# Patient Record
Sex: Male | Born: 1970 | Race: White | Hispanic: No | Marital: Single | Smoking: Never smoker
Health system: Southern US, Community
[De-identification: ages and names within clinical notes are randomized; demographics above are authoritative.]

## PROBLEM LIST (undated history)

## (undated) DIAGNOSIS — I10 Essential (primary) hypertension: Secondary | ICD-10-CM

## (undated) DIAGNOSIS — N200 Calculus of kidney: Secondary | ICD-10-CM

## (undated) DIAGNOSIS — E039 Hypothyroidism, unspecified: Secondary | ICD-10-CM

## (undated) HISTORY — PX: EXTRACORPOREAL SHOCK WAVE LITHOTRIPSY: SHX1557

---

## 2013-09-29 ENCOUNTER — Other Ambulatory Visit: Payer: Self-pay | Admitting: Neurosurgery

## 2013-10-05 ENCOUNTER — Encounter (HOSPITAL_COMMUNITY)
Admission: RE | Admit: 2013-10-05 | Discharge: 2013-10-05 | Disposition: A | Discharge status: Home / Self Care | Payer: BC Managed Care – PPO | Source: Ambulatory Visit | Attending: Anesthesiology | Admitting: Anesthesiology

## 2013-10-05 ENCOUNTER — Encounter (HOSPITAL_COMMUNITY)
Admission: RE | Admit: 2013-10-05 | Discharge: 2013-10-05 | Disposition: A | Discharge status: Home / Self Care | Payer: BC Managed Care – PPO | Source: Ambulatory Visit | Attending: Neurosurgery | Admitting: Neurosurgery

## 2013-10-05 ENCOUNTER — Encounter (HOSPITAL_COMMUNITY): Payer: Self-pay

## 2013-10-05 DIAGNOSIS — Z01810 Encounter for preprocedural cardiovascular examination: Secondary | ICD-10-CM | POA: Insufficient documentation

## 2013-10-05 DIAGNOSIS — Z01812 Encounter for preprocedural laboratory examination: Secondary | ICD-10-CM | POA: Insufficient documentation

## 2013-10-05 DIAGNOSIS — Z01818 Encounter for other preprocedural examination: Secondary | ICD-10-CM | POA: Insufficient documentation

## 2013-10-05 HISTORY — DX: Calculus of kidney: N20.0

## 2013-10-05 HISTORY — DX: Essential (primary) hypertension: I10

## 2013-10-05 HISTORY — DX: Hypothyroidism, unspecified: E03.9

## 2013-10-05 LAB — CBC
HCT: 45.2 % (ref 39.0–52.0)
Hemoglobin: 16.6 g/dL (ref 13.0–17.0)
MCH: 30.3 pg (ref 26.0–34.0)
MCHC: 36.7 g/dL — AB (ref 30.0–36.0)
MCV: 82.5 fL (ref 78.0–100.0)
PLATELETS: 195 10*3/uL (ref 150–400)
RBC: 5.48 MIL/uL (ref 4.22–5.81)
RDW: 12.6 % (ref 11.5–15.5)
WBC: 6.4 10*3/uL (ref 4.0–10.5)

## 2013-10-05 LAB — BASIC METABOLIC PANEL
BUN: 21 mg/dL (ref 6–23)
CALCIUM: 9.4 mg/dL (ref 8.4–10.5)
CO2: 30 mEq/L (ref 19–32)
CREATININE: 0.91 mg/dL (ref 0.50–1.35)
Chloride: 100 mEq/L (ref 96–112)
GFR calc Af Amer: >90 mL/min (ref >90)
GFR calc non Af Amer: >90 mL/min (ref >90)
Glucose, Bld: 95 mg/dL (ref 70–99)
Potassium: 4.4 mEq/L (ref 3.7–5.3)
SODIUM: 142 meq/L (ref 137–147)

## 2013-10-05 LAB — SURGICAL PCR SCREEN
MRSA, PCR: NEGATIVE
Staphylococcus aureus: NEGATIVE

## 2013-10-05 NOTE — Progress Notes (Signed)
Patient works around [employer].   Does not want to wear the blue blood bank until DOS.  da

## 2013-10-05 NOTE — Pre-Procedure Instructions (Signed)
Spencer LenzVictor A Dehner  10/05/2013   Your procedure is scheduled on:  Tuesday, March 24th   [facility]  2 * 3 at  8:00 AM.  Call this number if you have problems the morning of surgery: [phone]   Remember:   Do not eat food or drink liquids after midnight Monday.   Take these medicines the morning of surgery with A SIP OF WATER: Coreg, Levothyroxine   Do not wear jewelry - no rings watches.  Do not wear lotions or colognes.  You may NOT  wear deodorant.   Men may shave face and neck.   Do not bring valuables to the hospital.  [facility] is not [facility] for any belongings or valuables.               Contacts, dentures or bridgework may not be worn into surgery.  Leave suitcase in the car. After surgery it may be brought to your room.  For patients admitted to the hospital, discharge time is determined by your treatment team.               Name and phone number of your driver:  Roland EarlCandy  Strider    Cousin     [phone]   Special Instructions:  "preparing for surgery" instruction sheet   Please read over the following fact sheets that you were given: Blood Transfusion Information, MRSA Information and Surgical Site Infection Prevention

## 2013-10-09 MED ORDER — CEFAZOLIN SODIUM-DEXTROSE 2-3 GM-% IV SOLR
2.0000 g | INTRAVENOUS | Status: AC
  Administered 2013-10-10: 2 g via INTRAVENOUS
  Filled 2013-10-09: qty 50

## 2013-10-09 MED ORDER — DEXAMETHASONE SODIUM PHOSPHATE 10 MG/ML IJ SOLN
10.0000 mg | INTRAMUSCULAR | Status: AC
  Administered 2013-10-10: 10 mg via INTRAVENOUS
  Filled 2013-10-09: qty 1

## 2013-10-10 ENCOUNTER — Inpatient Hospital Stay (HOSPITAL_COMMUNITY)
Admission: RE | Admit: 2013-10-10 | Discharge: 2013-10-12 | DRG: 460 | Disposition: A | Discharge status: Home / Self Care | Payer: BC Managed Care – PPO | Source: Ambulatory Visit | Attending: Neurosurgery | Admitting: Neurosurgery

## 2013-10-10 ENCOUNTER — Ambulatory Visit (HOSPITAL_COMMUNITY): Payer: BC Managed Care – PPO

## 2013-10-10 ENCOUNTER — Encounter (HOSPITAL_COMMUNITY): Payer: Self-pay | Admitting: *Deleted

## 2013-10-10 ENCOUNTER — Ambulatory Visit (HOSPITAL_COMMUNITY): Payer: BC Managed Care – PPO | Admitting: Certified Registered Nurse Anesthetist

## 2013-10-10 ENCOUNTER — Encounter (HOSPITAL_COMMUNITY): Payer: BC Managed Care – PPO | Admitting: Certified Registered Nurse Anesthetist

## 2013-10-10 ENCOUNTER — Encounter (HOSPITAL_COMMUNITY)
Admission: RE | Disposition: A | Discharge status: Home / Self Care | Payer: Self-pay | Source: Ambulatory Visit | Attending: Neurosurgery

## 2013-10-10 DIAGNOSIS — Z01810 Encounter for preprocedural cardiovascular examination: Secondary | ICD-10-CM

## 2013-10-10 DIAGNOSIS — M4316 Spondylolisthesis, lumbar region: Secondary | ICD-10-CM | POA: Diagnosis present

## 2013-10-10 DIAGNOSIS — E039 Hypothyroidism, unspecified: Secondary | ICD-10-CM | POA: Diagnosis present

## 2013-10-10 DIAGNOSIS — Z79899 Other long term (current) drug therapy: Secondary | ICD-10-CM

## 2013-10-10 DIAGNOSIS — Z01812 Encounter for preprocedural laboratory examination: Secondary | ICD-10-CM

## 2013-10-10 DIAGNOSIS — I10 Essential (primary) hypertension: Secondary | ICD-10-CM | POA: Diagnosis present

## 2013-10-10 DIAGNOSIS — Z01818 Encounter for other preprocedural examination: Secondary | ICD-10-CM

## 2013-10-10 DIAGNOSIS — M48061 Spinal stenosis, lumbar region without neurogenic claudication: Secondary | ICD-10-CM | POA: Diagnosis present

## 2013-10-10 DIAGNOSIS — Q762 Congenital spondylolisthesis: Principal | ICD-10-CM

## 2013-10-10 HISTORY — PX: MAXIMUM ACCESS (MAS)POSTERIOR LUMBAR INTERBODY FUSION (PLIF) 1 LEVEL: SHX6368

## 2013-10-10 LAB — TYPE AND SCREEN
ABO/RH(D): A POS
Antibody Screen: NEGATIVE

## 2013-10-10 LAB — ABO/RH: ABO/RH(D): A POS

## 2013-10-10 SURGERY — FOR MAXIMUM ACCESS (MAS) POSTERIOR LUMBAR INTERBODY FUSION (PLIF) 1 LEVEL
Anesthesia: General | Site: Back

## 2013-10-10 MED ORDER — KCL IN DEXTROSE-NACL 20-5-0.45 MEQ/L-%-% IV SOLN
80.0000 mL/h | INTRAVENOUS | Status: DC
  Filled 2013-10-10 (×6): qty 1000

## 2013-10-10 MED ORDER — THROMBIN 20000 UNITS EX SOLR
CUTANEOUS | Status: DC | PRN
  Administered 2013-10-10: 11:00:00 via TOPICAL

## 2013-10-10 MED ORDER — ONDANSETRON HCL 4 MG/2ML IJ SOLN: 4.0000 mg | INTRAMUSCULAR | Status: DC | PRN

## 2013-10-10 MED ORDER — GLYCOPYRROLATE 0.2 MG/ML IJ SOLN
INTRAMUSCULAR | Status: DC | PRN
  Administered 2013-10-10: .6 mg via INTRAVENOUS

## 2013-10-10 MED ORDER — OXYCODONE-ACETAMINOPHEN 5-325 MG PO TABS
1.0000 | ORAL_TABLET | ORAL | Status: DC | PRN
  Administered 2013-10-10 – 2013-10-12 (×9): 2 via ORAL
  Filled 2013-10-10 (×9): qty 2

## 2013-10-10 MED ORDER — GLYCOPYRROLATE 0.2 MG/ML IJ SOLN
INTRAMUSCULAR | Status: AC
  Filled 2013-10-10: qty 3

## 2013-10-10 MED ORDER — CARVEDILOL 3.125 MG PO TABS
ORAL_TABLET | ORAL | Status: AC
  Filled 2013-10-10: qty 2

## 2013-10-10 MED ORDER — ARTIFICIAL TEARS OP OINT
TOPICAL_OINTMENT | OPHTHALMIC | Status: AC
  Filled 2013-10-10: qty 3.5

## 2013-10-10 MED ORDER — LIDOCAINE HCL (CARDIAC) 20 MG/ML IV SOLN
INTRAVENOUS | Status: DC | PRN
  Administered 2013-10-10: 80 mg via INTRAVENOUS

## 2013-10-10 MED ORDER — HYDROMORPHONE HCL PF 1 MG/ML IJ SOLN
0.2500 mg | INTRAMUSCULAR | Status: DC | PRN
  Administered 2013-10-10: 0.5 mg via INTRAVENOUS

## 2013-10-10 MED ORDER — ONDANSETRON HCL 4 MG/2ML IJ SOLN: 4.0000 mg | Freq: Once | INTRAMUSCULAR | Status: DC | PRN

## 2013-10-10 MED ORDER — NEOSTIGMINE METHYLSULFATE 1 MG/ML IJ SOLN
INTRAMUSCULAR | Status: AC
  Filled 2013-10-10: qty 10

## 2013-10-10 MED ORDER — CARVEDILOL 6.25 MG PO TABS
6.2500 mg | ORAL_TABLET | Freq: Once | ORAL | Status: AC
  Administered 2013-10-10: 6.25 mg via ORAL

## 2013-10-10 MED ORDER — SODIUM CHLORIDE 0.9 % IJ SOLN
3.0000 mL | Freq: Two times a day (BID) | INTRAMUSCULAR | Status: DC
  Administered 2013-10-11: 3 mL via INTRAVENOUS

## 2013-10-10 MED ORDER — PROPOFOL 10 MG/ML IV BOLUS
INTRAVENOUS | Status: AC
  Filled 2013-10-10: qty 20

## 2013-10-10 MED ORDER — 0.9 % SODIUM CHLORIDE (POUR BTL) OPTIME
TOPICAL | Status: DC | PRN
  Administered 2013-10-10: 1000 mL

## 2013-10-10 MED ORDER — NEOSTIGMINE METHYLSULFATE 1 MG/ML IJ SOLN
INTRAMUSCULAR | Status: DC | PRN
  Administered 2013-10-10: 4 mg via INTRAVENOUS

## 2013-10-10 MED ORDER — OXYCODONE HCL 5 MG PO TABS: 5.0000 mg | ORAL_TABLET | Freq: Once | ORAL | Status: DC | PRN

## 2013-10-10 MED ORDER — ROCURONIUM BROMIDE 100 MG/10ML IV SOLN
INTRAVENOUS | Status: DC | PRN
  Administered 2013-10-10: 30 mg via INTRAVENOUS
  Administered 2013-10-10: 50 mg via INTRAVENOUS
  Administered 2013-10-10: 10 mg via INTRAVENOUS

## 2013-10-10 MED ORDER — BUPIVACAINE HCL (PF) 0.5 % IJ SOLN
INTRAMUSCULAR | Status: DC | PRN
  Administered 2013-10-10: 20 mL

## 2013-10-10 MED ORDER — LIDOCAINE HCL (CARDIAC) 20 MG/ML IV SOLN
INTRAVENOUS | Status: AC
  Filled 2013-10-10: qty 5

## 2013-10-10 MED ORDER — OXYCODONE HCL 5 MG/5ML PO SOLN: 5.0000 mg | Freq: Once | ORAL | Status: DC | PRN

## 2013-10-10 MED ORDER — TRIAMTERENE-HCTZ 37.5-25 MG PO TABS
0.5000 | ORAL_TABLET | Freq: Every day | ORAL | Status: DC
  Administered 2013-10-10: 21:00:00 via ORAL
  Administered 2013-10-11 – 2013-10-12 (×2): 0.5 via ORAL
  Filled 2013-10-10 (×3): qty 0.5

## 2013-10-10 MED ORDER — MIDAZOLAM HCL 2 MG/2ML IJ SOLN
INTRAMUSCULAR | Status: AC
  Filled 2013-10-10: qty 2

## 2013-10-10 MED ORDER — ACETAMINOPHEN 325 MG PO TABS: 325.0000 mg | ORAL_TABLET | ORAL | Status: DC | PRN

## 2013-10-10 MED ORDER — SODIUM CHLORIDE 0.9 % IV SOLN: 250.0000 mL | INTRAVENOUS | Status: DC

## 2013-10-10 MED ORDER — ACETAMINOPHEN 325 MG PO TABS: 650.0000 mg | ORAL_TABLET | ORAL | Status: DC | PRN

## 2013-10-10 MED ORDER — ONDANSETRON HCL 4 MG/2ML IJ SOLN
INTRAMUSCULAR | Status: AC
  Filled 2013-10-10: qty 2

## 2013-10-10 MED ORDER — ACETAMINOPHEN 160 MG/5ML PO SOLN
325.0000 mg | ORAL | Status: DC | PRN
  Filled 2013-10-10: qty 20.3

## 2013-10-10 MED ORDER — BACITRACIN ZINC 500 UNIT/GM EX OINT
TOPICAL_OINTMENT | CUTANEOUS | Status: DC | PRN
  Administered 2013-10-10: 1 via TOPICAL

## 2013-10-10 MED ORDER — ARTIFICIAL TEARS OP OINT
TOPICAL_OINTMENT | OPHTHALMIC | Status: DC | PRN
  Administered 2013-10-10: 1 via OPHTHALMIC

## 2013-10-10 MED ORDER — PROPOFOL 10 MG/ML IV BOLUS
INTRAVENOUS | Status: DC | PRN
  Administered 2013-10-10: 150 mg via INTRAVENOUS

## 2013-10-10 MED ORDER — FENTANYL CITRATE 0.05 MG/ML IJ SOLN
INTRAMUSCULAR | Status: AC
  Filled 2013-10-10: qty 5

## 2013-10-10 MED ORDER — CARVEDILOL 6.25 MG PO TABS
6.2500 mg | ORAL_TABLET | Freq: Two times a day (BID) | ORAL | Status: DC
  Administered 2013-10-10 – 2013-10-12 (×4): 6.25 mg via ORAL
  Filled 2013-10-10 (×6): qty 1

## 2013-10-10 MED ORDER — SODIUM CHLORIDE 0.9 % IR SOLN
Status: DC | PRN
  Administered 2013-10-10: 11:00:00

## 2013-10-10 MED ORDER — PANTOPRAZOLE SODIUM 40 MG IV SOLR
40.0000 mg | Freq: Every day | INTRAVENOUS | Status: DC
  Administered 2013-10-10: 40 mg via INTRAVENOUS
  Filled 2013-10-10 (×2): qty 40

## 2013-10-10 MED ORDER — CYCLOBENZAPRINE HCL 10 MG PO TABS
10.0000 mg | ORAL_TABLET | Freq: Three times a day (TID) | ORAL | Status: DC | PRN
Start: 2013-10-10 — End: 2013-10-12
  Administered 2013-10-10 – 2013-10-12 (×3): 10 mg via ORAL
  Filled 2013-10-10 (×3): qty 1

## 2013-10-10 MED ORDER — SODIUM CHLORIDE 0.9 % IJ SOLN: 3.0000 mL | INTRAMUSCULAR | Status: DC | PRN

## 2013-10-10 MED ORDER — LACTATED RINGERS IV SOLN
INTRAVENOUS | Status: DC
  Administered 2013-10-10 (×3): via INTRAVENOUS

## 2013-10-10 MED ORDER — LEVOTHYROXINE SODIUM 75 MCG PO TABS
75.0000 ug | ORAL_TABLET | Freq: Every day | ORAL | Status: DC
  Administered 2013-10-11 – 2013-10-12 (×2): 75 ug via ORAL
  Filled 2013-10-10 (×3): qty 1

## 2013-10-10 MED ORDER — MENTHOL 3 MG MT LOZG: 1.0000 | LOZENGE | OROMUCOSAL | Status: DC | PRN

## 2013-10-10 MED ORDER — PHENOL 1.4 % MT LIQD: 1.0000 | OROMUCOSAL | Status: DC | PRN

## 2013-10-10 MED ORDER — HYDROMORPHONE HCL PF 1 MG/ML IJ SOLN: 1.0000 mg | INTRAMUSCULAR | Status: DC | PRN

## 2013-10-10 MED ORDER — ONDANSETRON HCL 4 MG/2ML IJ SOLN
INTRAMUSCULAR | Status: DC | PRN
  Administered 2013-10-10: 4 mg via INTRAVENOUS

## 2013-10-10 MED ORDER — CEFAZOLIN SODIUM-DEXTROSE 2-3 GM-% IV SOLR
2.0000 g | Freq: Three times a day (TID) | INTRAVENOUS | Status: AC
  Administered 2013-10-10 – 2013-10-11 (×2): 2 g via INTRAVENOUS
  Filled 2013-10-10 (×2): qty 50

## 2013-10-10 MED ORDER — ACETAMINOPHEN 650 MG RE SUPP
650.0000 mg | RECTAL | Status: DC | PRN
Start: 2013-10-10 — End: 2013-10-12

## 2013-10-10 MED ORDER — FENTANYL CITRATE 0.05 MG/ML IJ SOLN
INTRAMUSCULAR | Status: DC | PRN
  Administered 2013-10-10: 100 ug via INTRAVENOUS
  Administered 2013-10-10: 150 ug via INTRAVENOUS
  Administered 2013-10-10: 50 ug via INTRAVENOUS
  Administered 2013-10-10: 100 ug via INTRAVENOUS

## 2013-10-10 MED ORDER — HYDROMORPHONE HCL PF 1 MG/ML IJ SOLN
INTRAMUSCULAR | Status: AC
  Filled 2013-10-10: qty 1

## 2013-10-10 SURGICAL SUPPLY — 73 items
BAG DECANTER FOR FLEXI CONT (MISCELLANEOUS) ×2 IMPLANT
BENZOIN TINCTURE PRP APPL 2/3 (GAUZE/BANDAGES/DRESSINGS) ×2 IMPLANT
BLADE SURG ROTATE 9660 (MISCELLANEOUS) ×2 IMPLANT
BONE EQUIVA 10CC (Bone Implant) ×2 IMPLANT
BRUSH SCRUB EZ PLAIN DRY (MISCELLANEOUS) ×2 IMPLANT
BUR CUTTER 7.0 ROUND (BURR) ×2 IMPLANT
BUR MATCHSTICK NEURO 3.0 LAGG (BURR) ×2 IMPLANT
CANISTER SUCT 3000ML (MISCELLANEOUS) ×2 IMPLANT
CONT SPEC 4OZ CLIKSEAL STRL BL (MISCELLANEOUS) ×4 IMPLANT
COVER BACK TABLE 24X17X13 BIG (DRAPES) IMPLANT
COVER TABLE BACK 60X90 (DRAPES) ×2 IMPLANT
DERMABOND ADHESIVE PROPEN (GAUZE/BANDAGES/DRESSINGS) ×1
DERMABOND ADVANCED (GAUZE/BANDAGES/DRESSINGS) ×1
DERMABOND ADVANCED .7 DNX12 (GAUZE/BANDAGES/DRESSINGS) ×1 IMPLANT
DERMABOND ADVANCED .7 DNX6 (GAUZE/BANDAGES/DRESSINGS) ×1 IMPLANT
DILATOR NON-RADIOLUCENT CANN (MISCELLANEOUS) ×4 IMPLANT
DRAPE C-ARM 42X72 X-RAY (DRAPES) ×2 IMPLANT
DRAPE C-ARMOR (DRAPES) ×2 IMPLANT
DRAPE LAPAROTOMY 100X72X124 (DRAPES) ×2 IMPLANT
DRAPE SURG 17X23 STRL (DRAPES) ×4 IMPLANT
DRESSING TELFA 8X3 (GAUZE/BANDAGES/DRESSINGS) ×2 IMPLANT
DRSG OPSITE POSTOP 4X6 (GAUZE/BANDAGES/DRESSINGS) ×2 IMPLANT
DURAPREP 26ML APPLICATOR (WOUND CARE) ×2 IMPLANT
ELECT BLADE 4.0 EZ CLEAN MEGAD (MISCELLANEOUS) ×2
ELECT REM PT RETURN 9FT ADLT (ELECTROSURGICAL) ×2
ELECTRODE BLDE 4.0 EZ CLN MEGD (MISCELLANEOUS) ×1 IMPLANT
ELECTRODE REM PT RTRN 9FT ADLT (ELECTROSURGICAL) ×1 IMPLANT
EVACUATOR 1/8 PVC DRAIN (DRAIN) ×2 IMPLANT
GAUZE SPONGE 4X4 16PLY XRAY LF (GAUZE/BANDAGES/DRESSINGS) IMPLANT
GLOVE BIOGEL PI IND STRL 7.0 (GLOVE) ×2 IMPLANT
GLOVE BIOGEL PI INDICATOR 7.0 (GLOVE) ×2
GLOVE ECLIPSE 8.0 STRL XLNG CF (GLOVE) ×8 IMPLANT
GLOVE ECLIPSE 8.5 STRL (GLOVE) ×2 IMPLANT
GLOVE EXAM NITRILE LRG STRL (GLOVE) IMPLANT
GLOVE EXAM NITRILE MD LF STRL (GLOVE) IMPLANT
GLOVE EXAM NITRILE XS STR PU (GLOVE) IMPLANT
GLOVE SURG SS PI 7.0 STRL IVOR (GLOVE) ×8 IMPLANT
GOWN BRE IMP SLV AUR LG STRL (GOWN DISPOSABLE) IMPLANT
GOWN BRE IMP SLV AUR XL STRL (GOWN DISPOSABLE) IMPLANT
GOWN STRL REIN 2XL LVL4 (GOWN DISPOSABLE) IMPLANT
GOWN STRL REUS W/ TWL XL LVL3 (GOWN DISPOSABLE) ×5 IMPLANT
GOWN STRL REUS W/TWL XL LVL3 (GOWN DISPOSABLE) ×5
K-WIRE NITHNOL TROCAR TIP (WIRE) ×8 IMPLANT
KIT BASIN OR (CUSTOM PROCEDURE TRAY) ×2 IMPLANT
KIT ROOM TURNOVER OR (KITS) ×2 IMPLANT
MILL MEDIUM DISP (BLADE) ×2 IMPLANT
NEEDLE HYPO 22GX1.5 SAFETY (NEEDLE) ×2 IMPLANT
NEEDLE TARGETING (NEEDLE) ×8 IMPLANT
NS IRRIG 1000ML POUR BTL (IV SOLUTION) ×2 IMPLANT
PACK LAMINECTOMY NEURO (CUSTOM PROCEDURE TRAY) ×2 IMPLANT
PAD ARMBOARD 7.5X6 YLW CONV (MISCELLANEOUS) ×6 IMPLANT
PATTIES SURGICAL .75X.75 (GAUZE/BANDAGES/DRESSINGS) IMPLANT
PEEK OPTIMA 9X9X22 (Peek) ×4 IMPLANT
ROD PATHFINDER 40MM (Rod) ×2 IMPLANT
SCREW MIN INVASIVE 6.5X45 (Screw) ×4 IMPLANT
SCREW PATHFINDER 6.5X50 (Screw) ×4 IMPLANT
SPONGE GAUZE 4X4 12PLY (GAUZE/BANDAGES/DRESSINGS) IMPLANT
SPONGE LAP 4X18 X RAY DECT (DISPOSABLE) ×2 IMPLANT
SPONGE SURGIFOAM ABS GEL 100 (HEMOSTASIS) ×2 IMPLANT
STRIP CLOSURE SKIN 1/2X4 (GAUZE/BANDAGES/DRESSINGS) ×2 IMPLANT
SUT PROLENE 0 CT 1 30 (SUTURE) ×2 IMPLANT
SUT VIC AB 0 CT1 18XCR BRD8 (SUTURE) ×2 IMPLANT
SUT VIC AB 0 CT1 8-18 (SUTURE) ×2
SUT VIC AB 2-0 OS6 18 (SUTURE) ×6 IMPLANT
SUT VIC AB 3-0 CP2 18 (SUTURE) ×2 IMPLANT
SYR 20ML ECCENTRIC (SYRINGE) ×2 IMPLANT
TOP CLSR SEQUOIA (Orthopedic Implant) ×8 IMPLANT
TOWEL OR 17X24 6PK STRL BLUE (TOWEL DISPOSABLE) ×2 IMPLANT
TOWEL OR 17X26 10 PK STRL BLUE (TOWEL DISPOSABLE) ×2 IMPLANT
TRAP SPECIMEN MUCOUS 40CC (MISCELLANEOUS) ×2 IMPLANT
TRAY FOLEY CATH 14FRSI W/METER (CATHETERS) IMPLANT
TRAY FOLEY IC TEMP SENS 16FR (CATHETERS) ×2 IMPLANT
WATER STERILE IRR 1000ML POUR (IV SOLUTION) ×2 IMPLANT

## 2013-10-10 NOTE — Preoperative (Signed)
Beta Blockers   Reason not to administer Beta Blockers:Not Applicable 

## 2013-10-10 NOTE — H&P (Signed)
Spencer Perkins is an 43 y.o. male.   Chief Complaint: Back and bilateral leg pain HPI: The patient is a 43 year old gentleman whose been dealing with back and bilateral leg pain for many years. He's doing the best to tolerate it and his chiropractic treatments through the years including electrical stimulation and ultrasound as well as anti-inflammatory medications. His only gotten much with these treatments but really want to try and avoid surgery in spite of intermittent severe pain. Recently his pain has gotten significantly worse and to go down the right leg more than the left. He's had a new MRI scan and comes for evaluation. Patient takes vitamin through the years persistent walking increases pain with standing still is actually worse evaluation the office the films were reviewed which showed a progressive spondylolisthesis L4-5 with severe neural compression. After discussing the options the patient requested surgery now comes for interbody fusion with pedicle screw fixation. I had a long discussion with him regarding the risks and benefits of surgical intervention. The risks discussed include but are not limited to bleeding infection weakness numbness paralysis spinal fluid leak 12 instrumentation nonunion coma and death. We have discussed alternative methods of therapy offered risks and benefits of nonintervention. He's had the opportunity to ask numerous questions and appears to understand. With this information in hand he has requested we proceed with surgery.  Past Medical History  Diagnosis Date  . Kidney stones      tried extraction with no luck...then litho--all gone  . Hypertension   . Hypothyroidism     Past Surgical History  Procedure Laterality Date  . Extracorporeal shock wave lithotripsy      2012    History reviewed. No pertinent family history. Social History:  reports that he has never smoked. He does not have any smokeless tobacco history on file. He reports that he  does not drink alcohol or use illicit drugs.  Allergies:  Allergies  Allergen Reactions  . Codeine     Nervous shakes and crying    Medications Prior to Admission  Medication Sig Dispense Refill  . carvedilol (COREG) 6.25 MG tablet Take 6.25 mg by mouth 2 (two) times daily with a meal.      . levothyroxine (SYNTHROID, LEVOTHROID) 75 MCG tablet Take 75 mcg by mouth daily before breakfast.      . triamterene-hydrochlorothiazide (MAXZIDE-25) 37.5-25 MG per tablet Take 0.5 tablets by mouth daily.        Results for orders placed during the hospital encounter of 10/10/13 (from the past 48 hour(s))  TYPE AND SCREEN     Status: None   Collection Time    10/10/13  8:20 AM      Result Value Ref Range   ABO/RH(D) A POS     Antibody Screen NEG     Sample Expiration 10/13/2013    ABO/RH     Status: None   Collection Time    10/10/13  8:20 AM      Result Value Ref Range   ABO/RH(D) A POS     No results found.  Positive for nasal congestion high blood pressure and leg pain  Blood pressure 170/101, pulse 76, temperature 98.3 F (36.8 C), temperature source Oral, resp. rate 20, weight 106.283 kg (234 lb 5 oz), SpO2 99.00%.  The patient is awake alert and oriented. His no facial asymmetry. His gait is normal. He has mild decreased right knee jerk reflex. Strength is 5 over 5 sensation is intact Assessment/Plan Impression is  that of spondylolisthesis with severe neural compression at L4-5. The plan is for an L4-5 decompression with interbody fusion followed by percutaneous pedicle screw fixation.  Reinaldo MeekerKRITZER,Jonay Hitchcock O, MD 10/10/2013, 10:05 AM

## 2013-10-10 NOTE — Anesthesia Postprocedure Evaluation (Signed)
  Anesthesia Post-op Note  Patient: Spencer Perkins  Procedure(s) Performed: Procedure(s): LUMBAR FOUR TO LUMBAR FIVE MAXIMUM ACCESS (MAS) POSTERIOR LUMBAR INTERBODY FUSION (PLIF) 1 LEVEL (N/A)  Patient Location: PACU  Anesthesia Type:General  Level of Consciousness: awake, alert  and oriented  Airway and Oxygen Therapy: Patient Spontanous Breathing and Patient connected to nasal cannula oxygen  Post-op Pain: mild  Post-op Assessment: Post-op Vital signs reviewed, Patient's Cardiovascular Status Stable, Respiratory Function Stable, Patent Airway, No signs of Nausea or vomiting and Pain level controlled  Post-op Vital Signs: Reviewed and stable  Complications: No apparent anesthesia complications

## 2013-10-10 NOTE — Anesthesia Preprocedure Evaluation (Addendum)
Anesthesia Evaluation  Patient identified by MRN, date of birth, ID band Patient awake    Reviewed: Allergy & Precautions, H&P , NPO status , Patient's Chart, lab work & pertinent test results, reviewed documented beta blocker date and time   History of Anesthesia Complications Negative for: history of anesthetic complications  Airway Mallampati: II      Dental  (+) Teeth Intact,    Pulmonary neg pulmonary ROS,  breath sounds clear to auscultation        Cardiovascular hypertension, Pt. on home beta blockers and Pt. on medications Rhythm:Regular Rate:Normal     Neuro/Psych Lower back pain negative psych ROS   GI/Hepatic   Endo/Other  Hypothyroidism   Renal/GU Renal disease     Musculoskeletal negative musculoskeletal ROS (+)   Abdominal   Peds  Hematology   Anesthesia Other Findings   Reproductive/Obstetrics                         Anesthesia Physical Anesthesia Plan  ASA: II  Anesthesia Plan: General   Post-op Pain Management:    Induction: Intravenous  Airway Management Planned: Oral ETT  Additional Equipment: None  Intra-op Plan:   Post-operative Plan: Extubation in OR  Informed Consent: I have reviewed the patients History and Physical, chart, labs and discussed the procedure including the risks, benefits and alternatives for the proposed anesthesia with the patient or authorized representative who has indicated his/her understanding and acceptance.   Dental advisory given  Plan Discussed with: CRNA, Anesthesiologist and Surgeon  Anesthesia Plan Comments:        Anesthesia Quick Evaluation

## 2013-10-10 NOTE — Transfer of Care (Signed)
Immediate Anesthesia Transfer of Care Note  Patient: Spencer LenzVictor A Perkins  Procedure(s) Performed: Procedure(s): LUMBAR FOUR TO LUMBAR FIVE MAXIMUM ACCESS (MAS) POSTERIOR LUMBAR INTERBODY FUSION (PLIF) 1 LEVEL (N/A)  Patient Location: PACU  Anesthesia Type:General  Level of Consciousness: awake, alert  and oriented  Airway & Oxygen Therapy: Patient Spontanous Breathing and Patient connected to nasal cannula oxygen  Post-op Assessment: Report given to PACU RN and Post -op Vital signs reviewed and stable  Post vital signs: Reviewed and stable  Complications: No apparent anesthesia complications

## 2013-10-10 NOTE — Op Note (Signed)
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 Localizing x-rays taken prior to incision to identify the appropriate level. Midline incision was made above the spinous processes of L4 and L5. Using Bovie cutting current the incision was carried down to what not through the lumbar dorsal fascia. We then separated the plane between the subcutaneous fat and the fascia. We then did a subperiosteal dissection along the left-sided spinous processes and lamina and facet joints at L4-5. Self-retaining tract was placed for exposure and x-ray showed approach the appropriate level. Using the Leksell rongeur spinous processes of L4 and L5 were removed. We then removed the free-floating lamina of L4 with the inferior articulating facet. We then removed the superior one third of the L5 lamina and the medial two thirds of the superior facet. Residual bone was removed and saved for use later in the case. We then identified the disc space. We thoroughly decompressed the L4 and L5 nerve roots more so than needed for interbody fusion. We coagulated on the annulus incised the disc with a 15 blade. Was very hard to gain entry into the disc space initially we are able to distract up to a 9 mm size and reduce partially the spondylolisthesis. We then able to thoroughly cleaned out the disc and prepare for interbody fusion. We did this aggressively bilaterally. We used a  variety of instruments to remove all soft tissue residual disc and the endplates of L4 and L5 to prepare for interbody fusion. We then packed cages which were 9 mm x 9 mm x 22 mm deep. We filled with a mixture of autologous bone morselized allograft. We impacted without difficulty. Part placing the second cage we placed the same mixture deep within the interspace to help with interbody fusion. We then irrigated copiously controlled any bleeding with upper coagulation Gelfoam. We then closed the lumbar dorsal fascia the midline. We then placed percutaneous pedicle screws fashion standard fashion. We placed Jamshidi needle through the pedicles of L4 and L5 bilaterally. We passed a guidewire through each and followed in excellent position. We did sequential dilation through the soft tissue and then broke the cortical surface with the cortical bone all. We then tapped with a 6.0 mm tap and then placed 6.5 x 45 mm screws at L5 and 6.5 x 50 mm screws at L4. These were followed in good position. We then passed the rod down to the [place] and secured at the top of the screws with top loading nuts. We reduced the rod nicely the top of the screws and did tightening and final tightening with torque and counter torque. Final fluoroscopy in AP lateral direction looked excellent. We irrigated the wound once more and closed the fascia laterally with interrupted 0 Vicryl. We then closed the wound in multiple layers of Vicryl on the subcutaneous and subcuticular tissues. We did a running locking Prolene on the skin. Shortness was then applied the patient was extubated and taken to recovery room in stable condition.

## 2013-10-11 MED ORDER — PANTOPRAZOLE SODIUM 40 MG PO TBEC
40.0000 mg | DELAYED_RELEASE_TABLET | Freq: Every day | ORAL | Status: DC
  Administered 2013-10-11 – 2013-10-12 (×2): 40 mg via ORAL
  Filled 2013-10-11 (×2): qty 1

## 2013-10-11 NOTE — Progress Notes (Signed)
Utilization review complete 

## 2013-10-11 NOTE — Progress Notes (Signed)
Patient ID: Spencer LenzVictor A Perkins, male   DOB: [birth year], 43 y.o.   MRN: [id number] Afeb, vss Leg pain markedly improved C/O incisional pain. Wound clean and dry Will increase activity today, hopefully home tomorrow.

## 2013-10-12 ENCOUNTER — Encounter (HOSPITAL_COMMUNITY): Payer: Self-pay | Admitting: Neurosurgery

## 2013-10-12 MED ORDER — OXYCODONE-ACETAMINOPHEN 5-325 MG PO TABS: 1.0000 | ORAL_TABLET | ORAL | Status: DC | PRN

## 2013-10-12 NOTE — Discharge Summary (Signed)
  Physician Discharge Summary  Patient ID: Spencer LenzVictor A Perkins MRN: [id number] DOB/AGE: 10/09/1970 43 y.o.  Admit date: 10/10/2013 Discharge date: 10/12/2013  Admission Diagnoses:  Discharge Diagnoses:  Active Problems:   Spondylolisthesis of lumbar region   Discharged Condition: good  Hospital Course: Surgery Tuesday for 1 level plif. Did well. Pain much better post op. Ambulated well. Home pod 2, specific instructions given.  Consults: None  Significant Diagnostic Studies: none  Treatments: surgery: L 45 minimally invasive PLIF  Discharge Exam: Blood pressure 107/68, pulse 81, temperature 97.8 F (36.6 C), temperature source Oral, resp. rate 20, weight 106.283 kg (234 lb 5 oz), SpO2 92.00%. clean and dry wound; no new neuro issues  Disposition: Final discharge disposition not confirmed     Medication List    ASK your doctor about these medications       carvedilol 6.25 MG tablet  Commonly known as:  COREG  Take 6.25 mg by mouth 2 (two) times daily with a meal.     levothyroxine 75 MCG tablet  Commonly known as:  SYNTHROID, LEVOTHROID  Take 75 mcg by mouth daily before breakfast.     triamterene-hydrochlorothiazide 37.5-25 MG per tablet  Commonly known as:  MAXZIDE-25  Take 0.5 tablets by mouth daily.         At home rest most of the time. Get up 9 or 10 times each day and take a 15 or 20 minute walk. No riding in the car and to your first postoperative appointment. If you have neck surgery you may shower from the chest down starting on the third postoperative day. If you had back surgery he may start showering on the third postoperative day with saran wrap wrapped around your incisional area 3 times. After the shower remove the saran wrap. Take pain medicine as needed and other medications as instructed. Call my office for an appointment.  SignedReinaldo Meeker: Carrell Rahmani O, MD 10/12/2013, 12:45 PM

## 2013-10-12 NOTE — Progress Notes (Signed)
Pt. Alert and oriented,follows simple instructions, denies pain. Incision area without swelling, redness or S/S of infection. Voiding adequate clear yellow urine. Moving all extremities well and vitals stable and documented. Patient discharged home with family. Lumbar surgery notes instructions given to patient and family member for home safety and precautions. Pt. and family stated understanding of instructions given. Pain medication given to patient prior to discharged. 

## 2014-11-15 IMAGING — CR DG CHEST 2V
2 series · 2 of 2 positions shown · non-contrast
Comparison: None.

CLINICAL DATA: Preoperative lumbar surgery; hypertension

EXAM:
CHEST  2 VIEW

[w chest pa]
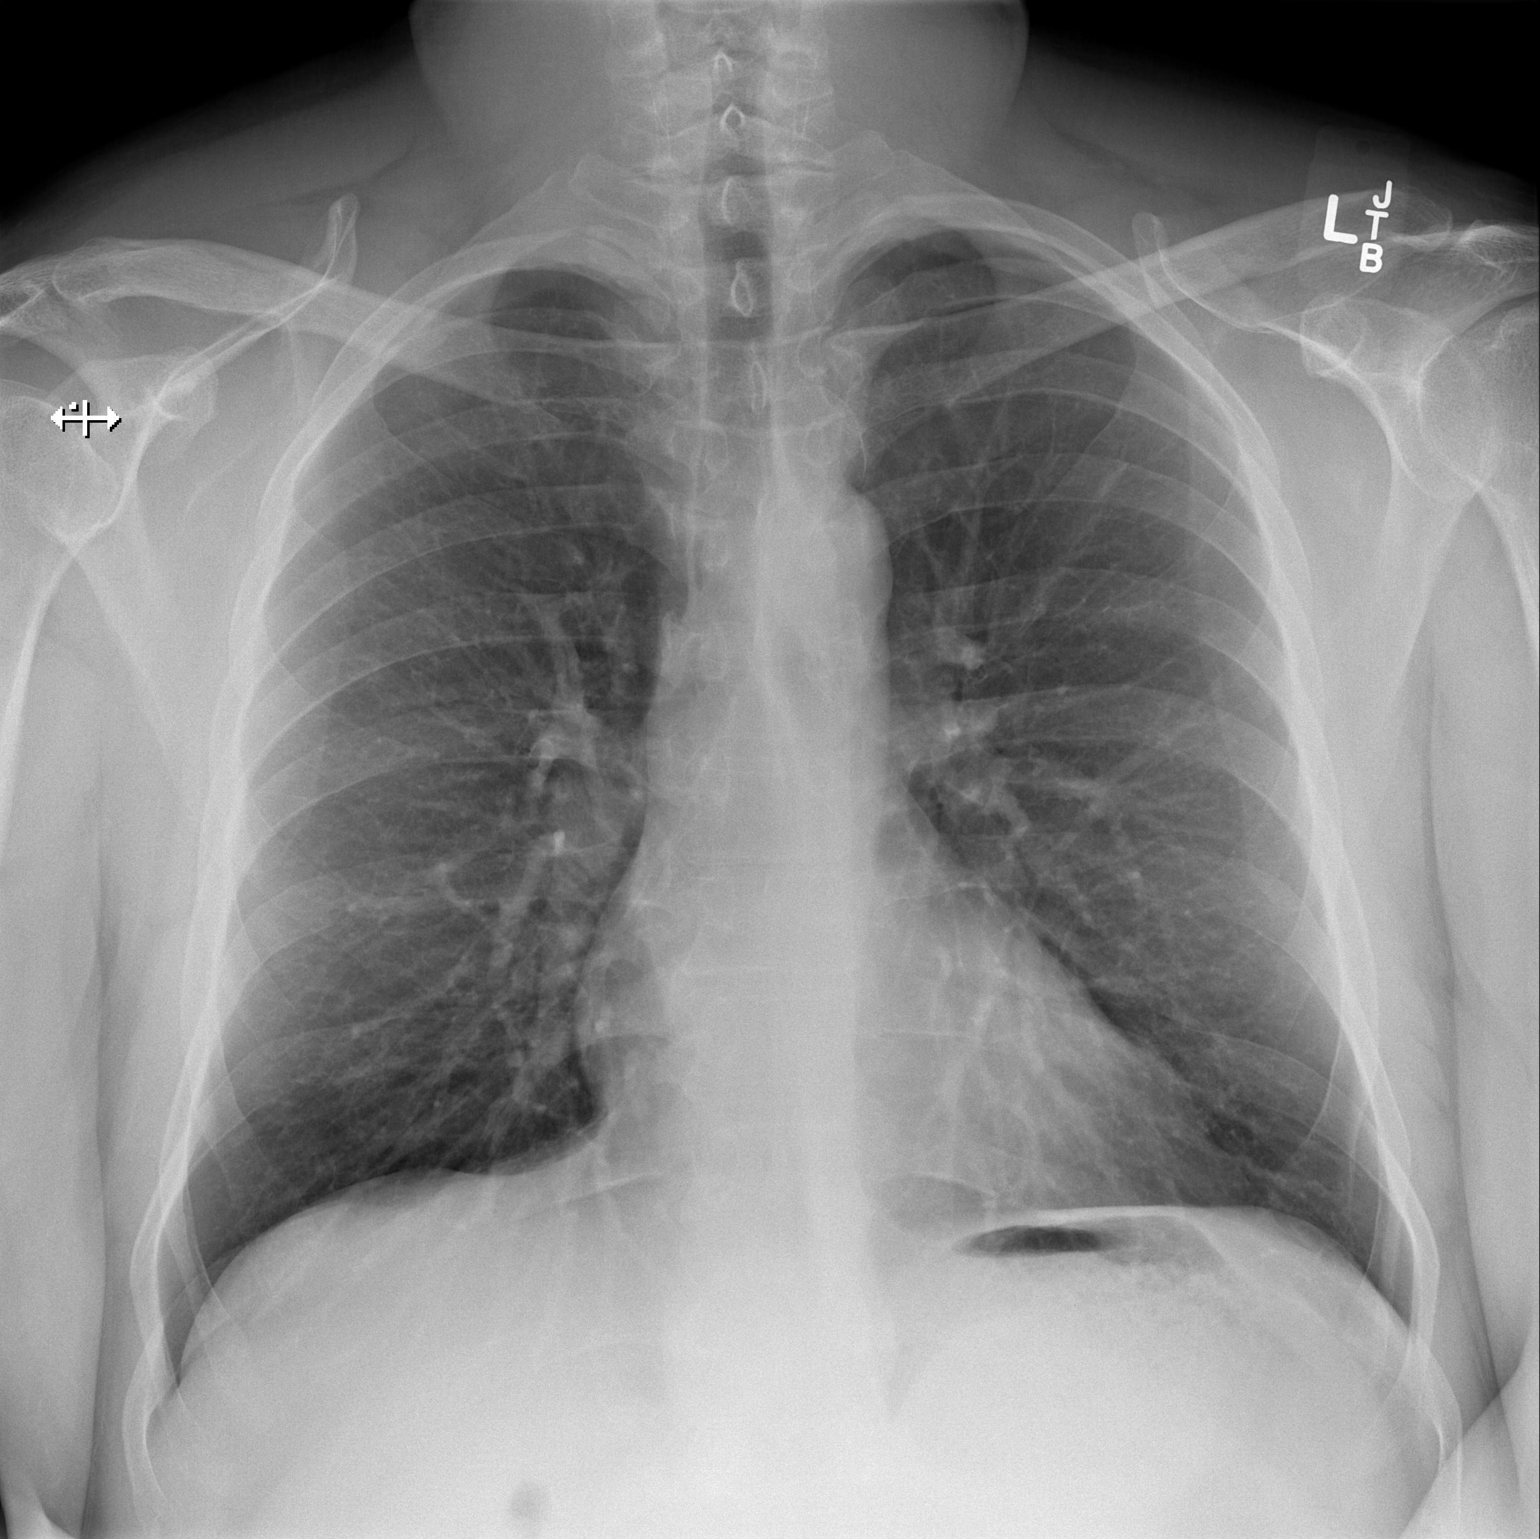

[w chest lat]
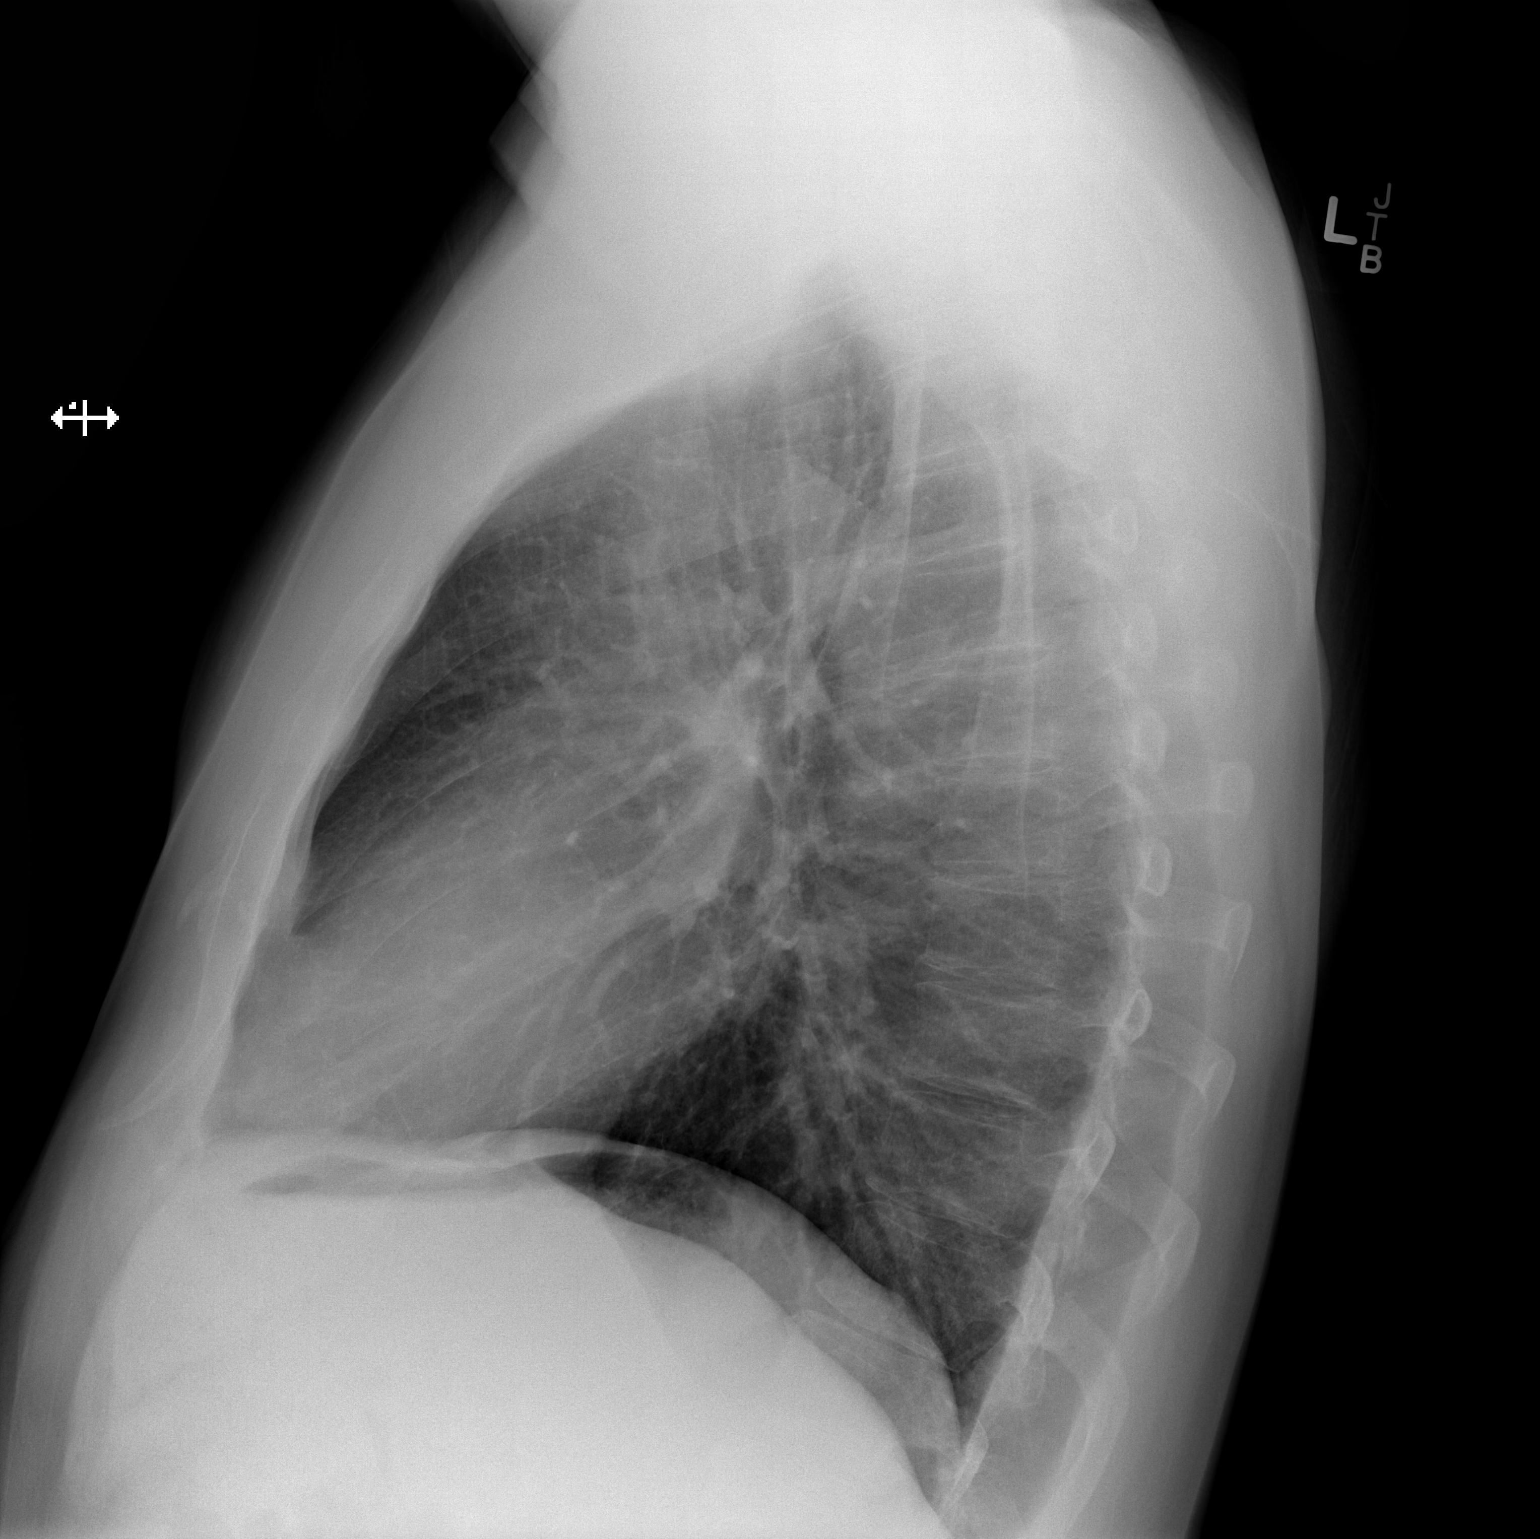

[2 of 2 positions shown; findings below may reference images not displayed]

FINDINGS: Lungs are clear. Heart size and pulmonary vascularity are normal. No
adenopathy. No bone lesions.
IMPRESSION: No abnormality noted.

## 2014-11-20 IMAGING — RF DG C-ARM 61-120 MIN
1 series · 2 of 2 positions shown · non-contrast
Comparison: MR LUMBAR SPINE W/O CM dated 09/07/2013

CLINICAL DATA: PLIF L4-5

EXAM:
LUMBAR SPINE - 2-3 VIEW; DG C-ARM 1-60 MIN
FLUOROSCOPY TIME:  1 min 58 seconds

[Series 1: run · 2 of 2 slices shown]
[im 1/2]
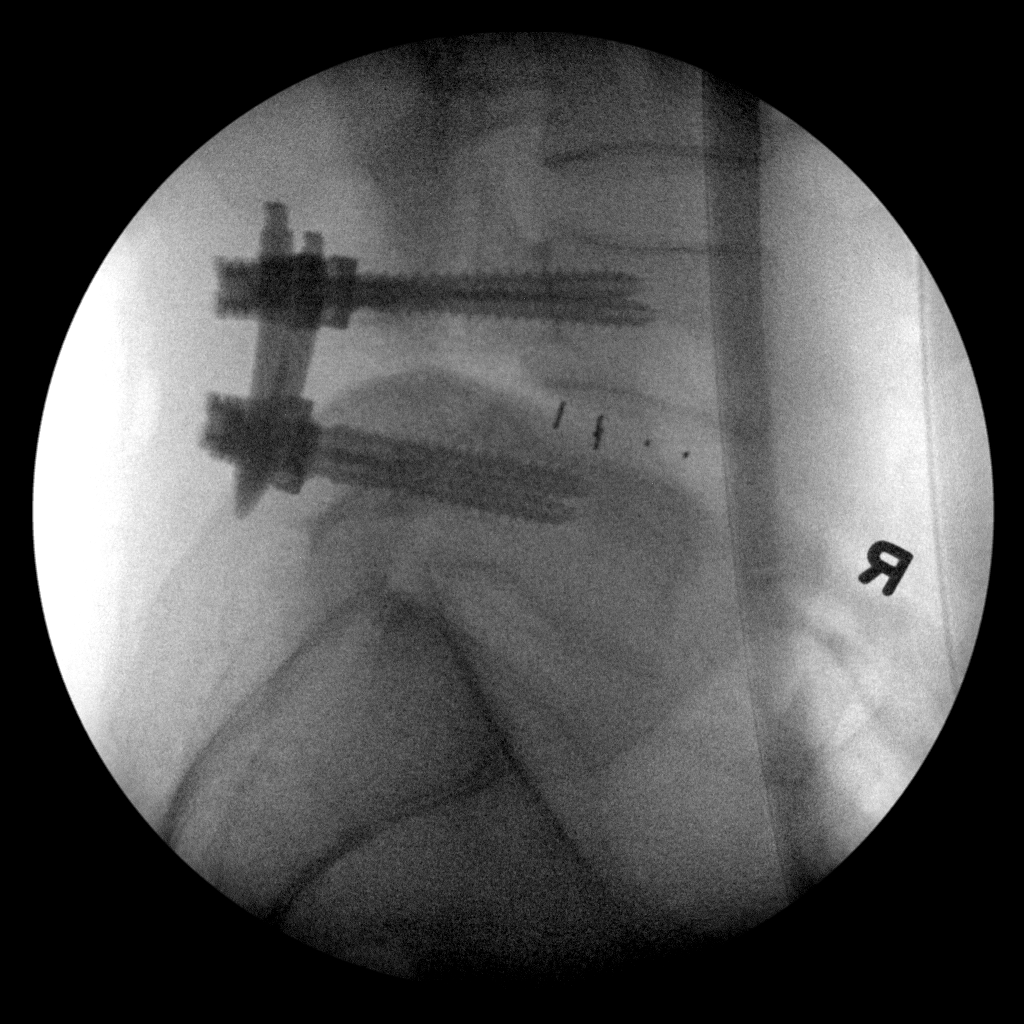
[im 2/2]
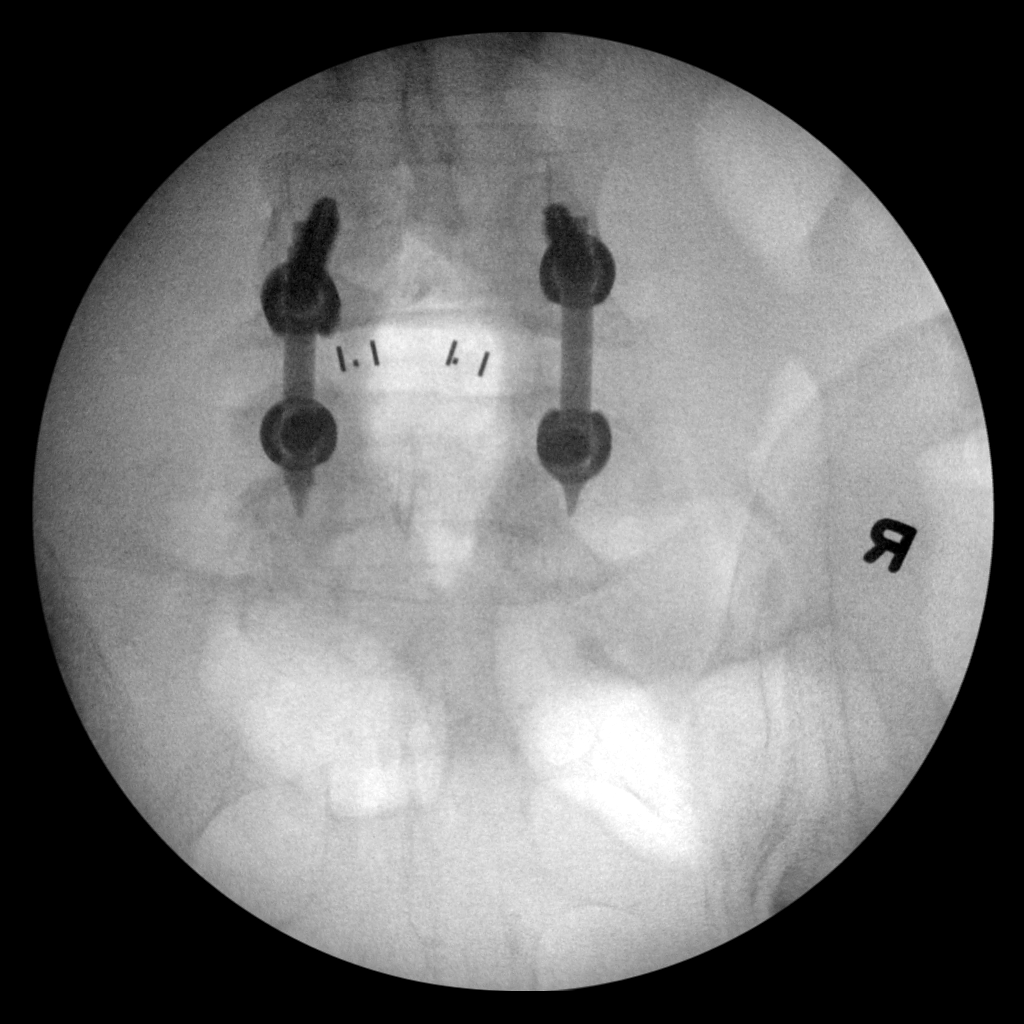

[2 of 2 positions shown; findings below may reference images not displayed]

FINDINGS: Two intraoperative fluoroscopic spot images demonstrate posterior
lumbar interbody fusion at L4-5 with bilateral pedicle screws in
satisfactory position. The interbody spacer is in satisfactory
position. There is grade 1 anterolisthesis of L4 on L5.
IMPRESSION: PLIF at L4-5.

## 2015-04-15 DIAGNOSIS — R002 Palpitations: Secondary | ICD-10-CM | POA: Insufficient documentation

## 2015-08-19 DIAGNOSIS — R079 Chest pain, unspecified: Secondary | ICD-10-CM | POA: Insufficient documentation

## 2015-11-18 DIAGNOSIS — E039 Hypothyroidism, unspecified: Secondary | ICD-10-CM | POA: Diagnosis not present

## 2015-11-18 DIAGNOSIS — M9902 Segmental and somatic dysfunction of thoracic region: Secondary | ICD-10-CM | POA: Diagnosis not present

## 2015-11-18 DIAGNOSIS — I10 Essential (primary) hypertension: Secondary | ICD-10-CM | POA: Diagnosis not present

## 2015-11-18 DIAGNOSIS — Z79899 Other long term (current) drug therapy: Secondary | ICD-10-CM | POA: Diagnosis not present

## 2015-11-18 DIAGNOSIS — M5413 Radiculopathy, cervicothoracic region: Secondary | ICD-10-CM | POA: Diagnosis not present

## 2015-11-18 DIAGNOSIS — M9901 Segmental and somatic dysfunction of cervical region: Secondary | ICD-10-CM | POA: Diagnosis not present

## 2015-11-18 DIAGNOSIS — M9903 Segmental and somatic dysfunction of lumbar region: Secondary | ICD-10-CM | POA: Diagnosis not present

## 2015-12-30 DIAGNOSIS — M5413 Radiculopathy, cervicothoracic region: Secondary | ICD-10-CM | POA: Diagnosis not present

## 2015-12-30 DIAGNOSIS — M9903 Segmental and somatic dysfunction of lumbar region: Secondary | ICD-10-CM | POA: Diagnosis not present

## 2015-12-30 DIAGNOSIS — M9901 Segmental and somatic dysfunction of cervical region: Secondary | ICD-10-CM | POA: Diagnosis not present

## 2015-12-30 DIAGNOSIS — M9902 Segmental and somatic dysfunction of thoracic region: Secondary | ICD-10-CM | POA: Diagnosis not present

## 2016-05-26 DIAGNOSIS — M79671 Pain in right foot: Secondary | ICD-10-CM | POA: Diagnosis not present

## 2016-05-26 DIAGNOSIS — R5383 Other fatigue: Secondary | ICD-10-CM | POA: Diagnosis not present

## 2016-05-26 DIAGNOSIS — M9901 Segmental and somatic dysfunction of cervical region: Secondary | ICD-10-CM | POA: Diagnosis not present

## 2016-05-26 DIAGNOSIS — M9902 Segmental and somatic dysfunction of thoracic region: Secondary | ICD-10-CM | POA: Diagnosis not present

## 2016-05-26 DIAGNOSIS — E039 Hypothyroidism, unspecified: Secondary | ICD-10-CM | POA: Diagnosis not present

## 2016-05-26 DIAGNOSIS — Z79899 Other long term (current) drug therapy: Secondary | ICD-10-CM | POA: Diagnosis not present

## 2016-05-26 DIAGNOSIS — M5413 Radiculopathy, cervicothoracic region: Secondary | ICD-10-CM | POA: Diagnosis not present

## 2016-05-26 DIAGNOSIS — I10 Essential (primary) hypertension: Secondary | ICD-10-CM | POA: Diagnosis not present

## 2016-05-26 DIAGNOSIS — M9903 Segmental and somatic dysfunction of lumbar region: Secondary | ICD-10-CM | POA: Diagnosis not present

## 2016-07-09 ENCOUNTER — Ambulatory Visit: Payer: Self-pay | Admitting: Sports Medicine

## 2016-07-29 ENCOUNTER — Ambulatory Visit: Payer: Self-pay | Admitting: Sports Medicine

## 2016-08-06 ENCOUNTER — Ambulatory Visit: Payer: Self-pay | Admitting: Sports Medicine

## 2016-08-07 ENCOUNTER — Encounter: Payer: Self-pay | Admitting: Sports Medicine

## 2016-08-07 ENCOUNTER — Ambulatory Visit (INDEPENDENT_AMBULATORY_CARE_PROVIDER_SITE_OTHER): Payer: BLUE CROSS/BLUE SHIELD

## 2016-08-07 ENCOUNTER — Ambulatory Visit (INDEPENDENT_AMBULATORY_CARE_PROVIDER_SITE_OTHER): Payer: BLUE CROSS/BLUE SHIELD | Admitting: Sports Medicine

## 2016-08-07 VITALS — BP 143/90 | HR 82 | Resp 18

## 2016-08-07 DIAGNOSIS — M722 Plantar fascial fibromatosis: Secondary | ICD-10-CM

## 2016-08-07 DIAGNOSIS — M79671 Pain in right foot: Secondary | ICD-10-CM | POA: Diagnosis not present

## 2016-08-07 MED ORDER — METHYLPREDNISOLONE 4 MG PO TBPK: ORAL_TABLET | ORAL | 0 refills | Status: DC

## 2016-08-07 NOTE — Progress Notes (Signed)
   Subjective:    Patient ID: Spencer Perkins, male    DOB: 29-Aug-1970, 46 y.o.   MRN: [id number]  HPI   I have some right heel pain and it just came up overnight and hurts in the morning and hurts on the bottom and hurts after I sit    Review of Systems  All other systems reviewed and are negative.      Objective:   Physical Exam        Assessment & Plan:

## 2016-08-07 NOTE — Progress Notes (Signed)
Subjective: Spencer Perkins is a 46 y.o. male patient presents to office with complaint of heel pain on the right. Patient admits to post static dyskinesia since October. Patient has treated this problem with heel pad without complete relief. Denies any other pedal complaints.   (SEE OTHER HPI AND ROS FROM NURSE NOTE)  Patient Active Problem List   Diagnosis Date Noted  . Spondylolisthesis of lumbar region 10/10/2013    Current Outpatient Prescriptions on File Prior to Visit  Medication Sig Dispense Refill  . carvedilol (COREG) 6.25 MG tablet Take 6.25 mg by mouth 2 (two) times daily with a meal.    . levothyroxine (SYNTHROID, LEVOTHROID) 75 MCG tablet Take 75 mcg by mouth daily before breakfast.    . oxyCODONE-acetaminophen (PERCOCET/ROXICET) 5-325 MG per tablet Take 1-2 tablets by mouth every 4 (four) hours as needed for moderate pain. 50 tablet 0  . triamterene-hydrochlorothiazide (MAXZIDE-25) 37.5-25 MG per tablet Take 0.5 tablets by mouth daily.     No current facility-administered medications on file prior to visit.     Allergies  Allergen Reactions  . Codeine     Nervous shakes and crying    Objective: Physical Exam General: The patient is alert and oriented x3 in no acute distress.  Dermatology: Skin is warm, dry and supple bilateral lower extremities. Nails 1-10 are normal. There is no erythema, edema, no eccymosis, no open lesions present. Integument is otherwise unremarkable.  Vascular: Dorsalis Pedis pulse and Posterior Tibial pulse are 2/4 bilateral. Capillary fill time is immediate to all digits.  Neurological: Grossly intact to light touch with an achilles reflex of +2/5 and a  negative Tinel's sign bilateral.  Musculoskeletal: Tenderness to palpation at the medial calcaneal tubercale and through the insertion of the plantar fascia on the right foot. No pain with compression of calcaneus bilateral. No pain with tuning fork to calcaneus bilateral. No pain with calf  compression bilateral. There is decreased Ankle joint range of motion bilateral. All other joints range of motion within normal limits bilateral. Strength 5/5 in all groups bilateral.   Gait: Unassisted, Antalgic avoid weight on right heel  Xray, Right foot:  Normal osseous mineralization. Joint spaces preserved. No fracture/dislocation/boney destruction. Calcaneal spur present with mild thickening of plantar fascia. No other soft tissue abnormalities or radiopaque foreign bodies.   Assessment and Plan: Problem List Items Addressed This Visit    None    Visit Diagnoses    Plantar fasciitis    -  Primary   Relevant Medications   methylPREDNISolone (MEDROL DOSEPAK) 4 MG TBPK tablet   Other Relevant Orders   DG Foot Complete Right   Pain of right heel       Relevant Medications   methylPREDNISolone (MEDROL DOSEPAK) 4 MG TBPK tablet      -Complete examination performed.  -Xrays reviewed -Discussed with patient in detail the condition of plantar fasciitis, how this occurs and general treatment options. Explained both conservative and surgical treatments.  -After oral consent and aseptic prep, injected a mixture containing 1 ml of 2% plain lidocaine, 1 ml 0.5% plain marcaine, 0.5 ml of kenalog 10 and 0.5 ml of dexamethasone phosphate into right heel. Post-injection care discussed with patient.  -Rx Medrol dose pack ; No anti-inflammatories at this time due to HTN history -Recommended good supportive shoes and advised use of OTC insert. Explained to patient that if these orthoses work well, we will continue with these. If these do not improve his condition and  pain, we  will consider custom molded orthoses. - Explained in detail the use of the fascial brace for right which was dispensed at today's visit. -Explained and dispensed to patient daily stretching exercises. -Recommend patient to ice affected area 1-2x daily. -Patient to return to office in 4 weeks for follow up or sooner if  problems or questions arise.  Asencion Islam, DPM

## 2016-08-07 NOTE — Patient Instructions (Signed)

## 2016-08-18 DIAGNOSIS — M9901 Segmental and somatic dysfunction of cervical region: Secondary | ICD-10-CM | POA: Diagnosis not present

## 2016-08-18 DIAGNOSIS — M5413 Radiculopathy, cervicothoracic region: Secondary | ICD-10-CM | POA: Diagnosis not present

## 2016-08-18 DIAGNOSIS — M9902 Segmental and somatic dysfunction of thoracic region: Secondary | ICD-10-CM | POA: Diagnosis not present

## 2016-08-18 DIAGNOSIS — M9903 Segmental and somatic dysfunction of lumbar region: Secondary | ICD-10-CM | POA: Diagnosis not present

## 2016-09-03 ENCOUNTER — Encounter: Payer: Self-pay | Admitting: Sports Medicine

## 2016-09-03 ENCOUNTER — Ambulatory Visit (INDEPENDENT_AMBULATORY_CARE_PROVIDER_SITE_OTHER): Payer: BLUE CROSS/BLUE SHIELD | Admitting: Sports Medicine

## 2016-09-03 DIAGNOSIS — M9902 Segmental and somatic dysfunction of thoracic region: Secondary | ICD-10-CM | POA: Diagnosis not present

## 2016-09-03 DIAGNOSIS — M5413 Radiculopathy, cervicothoracic region: Secondary | ICD-10-CM | POA: Diagnosis not present

## 2016-09-03 DIAGNOSIS — M722 Plantar fascial fibromatosis: Secondary | ICD-10-CM | POA: Diagnosis not present

## 2016-09-03 DIAGNOSIS — M79671 Pain in right foot: Secondary | ICD-10-CM

## 2016-09-03 DIAGNOSIS — M9903 Segmental and somatic dysfunction of lumbar region: Secondary | ICD-10-CM | POA: Diagnosis not present

## 2016-09-03 DIAGNOSIS — M9901 Segmental and somatic dysfunction of cervical region: Secondary | ICD-10-CM | POA: Diagnosis not present

## 2016-09-03 NOTE — Progress Notes (Signed)
Subjective: Spencer Perkins is a 46 y.o. male returns to office for follow up evaluation after Right heel injection for plantar fasciitis, injection #1 administered 3 weeks ago. Patient states that the injection seems to help his  pain; pain is now 0/10. Patient denies any recent changes in medications or new problems since last visit.   Patient Active Problem List   Diagnosis Date Noted  . Spondylolisthesis of lumbar region 10/10/2013    Current Outpatient Prescriptions on File Prior to Visit  Medication Sig Dispense Refill  . carvedilol (COREG) 6.25 MG tablet Take 6.25 mg by mouth 2 (two) times daily with a meal.    . levothyroxine (SYNTHROID, LEVOTHROID) 75 MCG tablet Take 75 mcg by mouth daily before breakfast.    . methylPREDNISolone (MEDROL DOSEPAK) 4 MG TBPK tablet Take as instructed 21 tablet 0  . oxyCODONE-acetaminophen (PERCOCET/ROXICET) 5-325 MG per tablet Take 1-2 tablets by mouth every 4 (four) hours as needed for moderate pain. 50 tablet 0  . triamterene-hydrochlorothiazide (MAXZIDE-25) 37.5-25 MG per tablet Take 0.5 tablets by mouth daily.     No current facility-administered medications on file prior to visit.     Allergies  Allergen Reactions  . Codeine Anxiety    Nervous shakes and crying    Objective:   General:  Alert and oriented x 3, in no acute distress  Dermatology: Skin is warm, dry, and supple bilateral. Nails are within normal limits. There is no lower extremity erythema, no eccymosis, no open lesions present bilateral.   Vascular: Dorsalis Pedis and Posterior Tibial pedal pulses are 2/4 bilateral. + hair growth noted bilateral. Capillary Fill Time is 3 seconds in all digits. No varicosities, No edema bilateral lower extremities.   Neurological: Sensation grossly intact to light touch with an achilles reflex of +2 and a negative Tinel's sign bilateral. Vibratory, sharp/dull, Semmes Weinstein Monofilament within normal limits.   Musculoskeletal: There is  no tenderness to palpation at the medial calcaneal tubercale and through the insertion of the plantar fascia on the right foot. No pain with compression to calcaneus or application of tuning fork. There is decreased Ankle joint range of motion bilateral. All other joints range of motion  within normal limits bilateral. Strength 5/5 bilateral.   Assessment and Plan: Problem List Items Addressed This Visit    None    Visit Diagnoses    Plantar fasciitis    -  Primary   Pain of right heel          -Complete examination performed.  -Previous x-rays reviewed. -Re-Discussed with patient in detail the condition of plantar fasciitis, how this  occurs related to the foot type of the patient and general treatment options. -Wean from fascial brace as instructed -Continue with stretching, icing, good supportive shoes, inserts daily.  -Discussed long term care and reocurrence; will closely monitor; if fails to improve will consider other treatment modalities.  -Patient to return to office PRN or sooner if problems or questions arise.  Asencion Islamitorya Curby Carswell, DPM

## 2016-11-24 DIAGNOSIS — M722 Plantar fascial fibromatosis: Secondary | ICD-10-CM | POA: Diagnosis not present

## 2016-11-24 DIAGNOSIS — Z79899 Other long term (current) drug therapy: Secondary | ICD-10-CM | POA: Diagnosis not present

## 2016-11-24 DIAGNOSIS — R51 Headache: Secondary | ICD-10-CM | POA: Diagnosis not present

## 2016-11-24 DIAGNOSIS — E039 Hypothyroidism, unspecified: Secondary | ICD-10-CM | POA: Diagnosis not present

## 2016-11-24 DIAGNOSIS — M9901 Segmental and somatic dysfunction of cervical region: Secondary | ICD-10-CM | POA: Diagnosis not present

## 2016-11-24 DIAGNOSIS — M542 Cervicalgia: Secondary | ICD-10-CM | POA: Diagnosis not present

## 2016-11-24 DIAGNOSIS — M62830 Muscle spasm of back: Secondary | ICD-10-CM | POA: Diagnosis not present

## 2016-11-24 DIAGNOSIS — I10 Essential (primary) hypertension: Secondary | ICD-10-CM | POA: Diagnosis not present

## 2017-04-28 DIAGNOSIS — I10 Essential (primary) hypertension: Secondary | ICD-10-CM | POA: Diagnosis not present

## 2017-04-28 DIAGNOSIS — R42 Dizziness and giddiness: Secondary | ICD-10-CM | POA: Diagnosis not present

## 2017-04-28 DIAGNOSIS — E039 Hypothyroidism, unspecified: Secondary | ICD-10-CM | POA: Diagnosis not present

## 2017-04-28 DIAGNOSIS — M722 Plantar fascial fibromatosis: Secondary | ICD-10-CM | POA: Diagnosis not present

## 2017-07-29 DIAGNOSIS — E039 Hypothyroidism, unspecified: Secondary | ICD-10-CM | POA: Diagnosis not present

## 2017-07-29 DIAGNOSIS — Z79899 Other long term (current) drug therapy: Secondary | ICD-10-CM | POA: Diagnosis not present

## 2017-07-29 DIAGNOSIS — I10 Essential (primary) hypertension: Secondary | ICD-10-CM | POA: Diagnosis not present

## 2017-07-29 DIAGNOSIS — M79671 Pain in right foot: Secondary | ICD-10-CM | POA: Diagnosis not present

## 2017-07-29 DIAGNOSIS — R5383 Other fatigue: Secondary | ICD-10-CM | POA: Diagnosis not present

## 2017-08-02 DIAGNOSIS — E291 Testicular hypofunction: Secondary | ICD-10-CM | POA: Diagnosis not present

## 2017-08-02 DIAGNOSIS — E559 Vitamin D deficiency, unspecified: Secondary | ICD-10-CM | POA: Diagnosis not present

## 2017-08-02 DIAGNOSIS — R51 Headache: Secondary | ICD-10-CM | POA: Diagnosis not present

## 2017-08-02 DIAGNOSIS — M542 Cervicalgia: Secondary | ICD-10-CM | POA: Diagnosis not present

## 2017-08-02 DIAGNOSIS — M62830 Muscle spasm of back: Secondary | ICD-10-CM | POA: Diagnosis not present

## 2017-08-02 DIAGNOSIS — M2550 Pain in unspecified joint: Secondary | ICD-10-CM | POA: Diagnosis not present

## 2017-08-02 DIAGNOSIS — R5383 Other fatigue: Secondary | ICD-10-CM | POA: Diagnosis not present

## 2017-08-02 DIAGNOSIS — M9901 Segmental and somatic dysfunction of cervical region: Secondary | ICD-10-CM | POA: Diagnosis not present

## 2017-08-02 DIAGNOSIS — Z125 Encounter for screening for malignant neoplasm of prostate: Secondary | ICD-10-CM | POA: Diagnosis not present

## 2017-10-01 DIAGNOSIS — Z23 Encounter for immunization: Secondary | ICD-10-CM | POA: Diagnosis not present

## 2017-11-23 DIAGNOSIS — Z23 Encounter for immunization: Secondary | ICD-10-CM | POA: Diagnosis not present

## 2018-06-01 DIAGNOSIS — R748 Abnormal levels of other serum enzymes: Secondary | ICD-10-CM | POA: Diagnosis not present

## 2018-06-01 DIAGNOSIS — E039 Hypothyroidism, unspecified: Secondary | ICD-10-CM | POA: Diagnosis not present

## 2018-06-01 DIAGNOSIS — Z79899 Other long term (current) drug therapy: Secondary | ICD-10-CM | POA: Diagnosis not present

## 2018-06-01 DIAGNOSIS — I10 Essential (primary) hypertension: Secondary | ICD-10-CM | POA: Diagnosis not present

## 2018-06-01 DIAGNOSIS — J069 Acute upper respiratory infection, unspecified: Secondary | ICD-10-CM | POA: Diagnosis not present

## 2018-07-18 DIAGNOSIS — Z23 Encounter for immunization: Secondary | ICD-10-CM | POA: Diagnosis not present

## 2019-03-28 DIAGNOSIS — M542 Cervicalgia: Secondary | ICD-10-CM | POA: Diagnosis not present

## 2019-03-28 DIAGNOSIS — R51 Headache: Secondary | ICD-10-CM | POA: Diagnosis not present

## 2019-03-28 DIAGNOSIS — M62830 Muscle spasm of back: Secondary | ICD-10-CM | POA: Diagnosis not present

## 2019-03-28 DIAGNOSIS — M9902 Segmental and somatic dysfunction of thoracic region: Secondary | ICD-10-CM | POA: Diagnosis not present

## 2019-05-08 DIAGNOSIS — M6289 Other specified disorders of muscle: Secondary | ICD-10-CM | POA: Diagnosis not present

## 2019-06-05 DIAGNOSIS — E786 Lipoprotein deficiency: Secondary | ICD-10-CM | POA: Diagnosis not present

## 2019-06-05 DIAGNOSIS — E039 Hypothyroidism, unspecified: Secondary | ICD-10-CM | POA: Diagnosis not present

## 2019-06-05 DIAGNOSIS — I10 Essential (primary) hypertension: Secondary | ICD-10-CM | POA: Diagnosis not present

## 2019-06-05 DIAGNOSIS — Z79899 Other long term (current) drug therapy: Secondary | ICD-10-CM | POA: Diagnosis not present

## 2019-06-05 DIAGNOSIS — E559 Vitamin D deficiency, unspecified: Secondary | ICD-10-CM | POA: Diagnosis not present

## 2019-08-09 DIAGNOSIS — E039 Hypothyroidism, unspecified: Secondary | ICD-10-CM | POA: Diagnosis not present

## 2019-09-06 DIAGNOSIS — I10 Essential (primary) hypertension: Secondary | ICD-10-CM | POA: Diagnosis not present

## 2019-10-25 DIAGNOSIS — E039 Hypothyroidism, unspecified: Secondary | ICD-10-CM | POA: Diagnosis not present

## 2019-10-25 DIAGNOSIS — M542 Cervicalgia: Secondary | ICD-10-CM | POA: Diagnosis not present

## 2019-10-25 DIAGNOSIS — M549 Dorsalgia, unspecified: Secondary | ICD-10-CM | POA: Diagnosis not present

## 2019-10-25 DIAGNOSIS — R700 Elevated erythrocyte sedimentation rate: Secondary | ICD-10-CM | POA: Diagnosis not present

## 2019-10-25 DIAGNOSIS — I10 Essential (primary) hypertension: Secondary | ICD-10-CM | POA: Diagnosis not present

## 2019-11-20 DIAGNOSIS — R748 Abnormal levels of other serum enzymes: Secondary | ICD-10-CM | POA: Diagnosis not present

## 2019-11-20 DIAGNOSIS — R7989 Other specified abnormal findings of blood chemistry: Secondary | ICD-10-CM | POA: Diagnosis not present

## 2019-12-27 DIAGNOSIS — R972 Elevated prostate specific antigen [PSA]: Secondary | ICD-10-CM | POA: Diagnosis not present

## 2019-12-28 DIAGNOSIS — E669 Obesity, unspecified: Secondary | ICD-10-CM | POA: Diagnosis not present

## 2019-12-28 DIAGNOSIS — Z885 Allergy status to narcotic agent status: Secondary | ICD-10-CM | POA: Diagnosis not present

## 2019-12-28 DIAGNOSIS — Z87442 Personal history of urinary calculi: Secondary | ICD-10-CM | POA: Diagnosis not present

## 2019-12-28 DIAGNOSIS — I10 Essential (primary) hypertension: Secondary | ICD-10-CM | POA: Diagnosis not present

## 2019-12-28 DIAGNOSIS — E876 Hypokalemia: Secondary | ICD-10-CM | POA: Diagnosis not present

## 2019-12-28 DIAGNOSIS — Z9889 Other specified postprocedural states: Secondary | ICD-10-CM | POA: Diagnosis not present

## 2019-12-28 DIAGNOSIS — Z6832 Body mass index (BMI) 32.0-32.9, adult: Secondary | ICD-10-CM | POA: Diagnosis not present

## 2019-12-28 DIAGNOSIS — D121 Benign neoplasm of appendix: Secondary | ICD-10-CM | POA: Diagnosis not present

## 2019-12-28 DIAGNOSIS — R1031 Right lower quadrant pain: Secondary | ICD-10-CM | POA: Diagnosis not present

## 2019-12-28 DIAGNOSIS — K353 Acute appendicitis with localized peritonitis, without perforation or gangrene: Secondary | ICD-10-CM | POA: Diagnosis not present

## 2019-12-28 DIAGNOSIS — Z20822 Contact with and (suspected) exposure to covid-19: Secondary | ICD-10-CM | POA: Diagnosis not present

## 2019-12-28 DIAGNOSIS — K3580 Unspecified acute appendicitis: Secondary | ICD-10-CM | POA: Diagnosis not present

## 2019-12-29 DIAGNOSIS — K3580 Unspecified acute appendicitis: Secondary | ICD-10-CM | POA: Diagnosis not present

## 2019-12-29 DIAGNOSIS — K353 Acute appendicitis with localized peritonitis, without perforation or gangrene: Secondary | ICD-10-CM | POA: Diagnosis not present

## 2019-12-29 DIAGNOSIS — D121 Benign neoplasm of appendix: Secondary | ICD-10-CM | POA: Diagnosis not present

## 2019-12-29 DIAGNOSIS — Z9889 Other specified postprocedural states: Secondary | ICD-10-CM | POA: Diagnosis not present

## 2019-12-29 DIAGNOSIS — I10 Essential (primary) hypertension: Secondary | ICD-10-CM | POA: Diagnosis not present

## 2020-01-11 DIAGNOSIS — Z09 Encounter for follow-up examination after completed treatment for conditions other than malignant neoplasm: Secondary | ICD-10-CM | POA: Diagnosis not present

## 2020-01-11 DIAGNOSIS — Z1211 Encounter for screening for malignant neoplasm of colon: Secondary | ICD-10-CM | POA: Diagnosis not present

## 2020-03-15 DIAGNOSIS — I10 Essential (primary) hypertension: Secondary | ICD-10-CM | POA: Diagnosis not present

## 2020-03-15 DIAGNOSIS — Z1211 Encounter for screening for malignant neoplasm of colon: Secondary | ICD-10-CM | POA: Diagnosis not present

## 2020-03-15 DIAGNOSIS — D124 Benign neoplasm of descending colon: Secondary | ICD-10-CM | POA: Diagnosis not present

## 2020-03-28 DIAGNOSIS — D126 Benign neoplasm of colon, unspecified: Secondary | ICD-10-CM | POA: Diagnosis not present

## 2020-07-23 DIAGNOSIS — R509 Fever, unspecified: Secondary | ICD-10-CM | POA: Diagnosis not present

## 2020-07-23 DIAGNOSIS — Z20822 Contact with and (suspected) exposure to covid-19: Secondary | ICD-10-CM | POA: Diagnosis not present

## 2020-07-23 DIAGNOSIS — J019 Acute sinusitis, unspecified: Secondary | ICD-10-CM | POA: Diagnosis not present

## 2020-07-23 DIAGNOSIS — R059 Cough, unspecified: Secondary | ICD-10-CM | POA: Diagnosis not present

## 2020-07-30 DIAGNOSIS — I10 Essential (primary) hypertension: Secondary | ICD-10-CM | POA: Diagnosis not present

## 2020-08-01 DIAGNOSIS — J0140 Acute pansinusitis, unspecified: Secondary | ICD-10-CM | POA: Diagnosis not present

## 2021-01-22 DIAGNOSIS — R972 Elevated prostate specific antigen [PSA]: Secondary | ICD-10-CM | POA: Diagnosis not present

## 2021-02-24 DIAGNOSIS — E039 Hypothyroidism, unspecified: Secondary | ICD-10-CM | POA: Diagnosis not present

## 2021-02-24 DIAGNOSIS — R0982 Postnasal drip: Secondary | ICD-10-CM | POA: Diagnosis not present

## 2021-02-24 DIAGNOSIS — L723 Sebaceous cyst: Secondary | ICD-10-CM | POA: Diagnosis not present

## 2021-02-24 DIAGNOSIS — I10 Essential (primary) hypertension: Secondary | ICD-10-CM | POA: Diagnosis not present

## 2021-03-05 DIAGNOSIS — L723 Sebaceous cyst: Secondary | ICD-10-CM | POA: Diagnosis not present

## 2021-05-27 DIAGNOSIS — M9901 Segmental and somatic dysfunction of cervical region: Secondary | ICD-10-CM | POA: Diagnosis not present

## 2021-05-27 DIAGNOSIS — M62830 Muscle spasm of back: Secondary | ICD-10-CM | POA: Diagnosis not present

## 2021-05-27 DIAGNOSIS — M542 Cervicalgia: Secondary | ICD-10-CM | POA: Diagnosis not present

## 2021-05-27 DIAGNOSIS — M9902 Segmental and somatic dysfunction of thoracic region: Secondary | ICD-10-CM | POA: Diagnosis not present

## 2021-05-29 DIAGNOSIS — Z79899 Other long term (current) drug therapy: Secondary | ICD-10-CM | POA: Diagnosis not present

## 2021-05-29 DIAGNOSIS — M9901 Segmental and somatic dysfunction of cervical region: Secondary | ICD-10-CM | POA: Diagnosis not present

## 2021-05-29 DIAGNOSIS — Z6832 Body mass index (BMI) 32.0-32.9, adult: Secondary | ICD-10-CM | POA: Diagnosis not present

## 2021-05-29 DIAGNOSIS — I10 Essential (primary) hypertension: Secondary | ICD-10-CM | POA: Diagnosis not present

## 2021-05-29 DIAGNOSIS — M9903 Segmental and somatic dysfunction of lumbar region: Secondary | ICD-10-CM | POA: Diagnosis not present

## 2021-05-29 DIAGNOSIS — E039 Hypothyroidism, unspecified: Secondary | ICD-10-CM | POA: Diagnosis not present

## 2021-05-29 DIAGNOSIS — M5451 Vertebrogenic low back pain: Secondary | ICD-10-CM | POA: Diagnosis not present

## 2021-05-29 DIAGNOSIS — M9905 Segmental and somatic dysfunction of pelvic region: Secondary | ICD-10-CM | POA: Diagnosis not present

## 2021-09-02 DIAGNOSIS — I10 Essential (primary) hypertension: Secondary | ICD-10-CM | POA: Diagnosis not present

## 2021-09-02 DIAGNOSIS — E786 Lipoprotein deficiency: Secondary | ICD-10-CM | POA: Diagnosis not present

## 2021-09-02 DIAGNOSIS — Z79899 Other long term (current) drug therapy: Secondary | ICD-10-CM | POA: Diagnosis not present

## 2021-09-02 DIAGNOSIS — E039 Hypothyroidism, unspecified: Secondary | ICD-10-CM | POA: Diagnosis not present

## 2021-12-31 DIAGNOSIS — M25561 Pain in right knee: Secondary | ICD-10-CM | POA: Diagnosis not present

## 2021-12-31 DIAGNOSIS — I10 Essential (primary) hypertension: Secondary | ICD-10-CM | POA: Diagnosis not present

## 2021-12-31 DIAGNOSIS — Z79899 Other long term (current) drug therapy: Secondary | ICD-10-CM | POA: Diagnosis not present

## 2021-12-31 DIAGNOSIS — R531 Weakness: Secondary | ICD-10-CM | POA: Diagnosis not present

## 2021-12-31 DIAGNOSIS — E039 Hypothyroidism, unspecified: Secondary | ICD-10-CM | POA: Diagnosis not present

## 2021-12-31 DIAGNOSIS — R002 Palpitations: Secondary | ICD-10-CM | POA: Diagnosis not present

## 2021-12-31 DIAGNOSIS — M25461 Effusion, right knee: Secondary | ICD-10-CM | POA: Diagnosis not present

## 2021-12-31 DIAGNOSIS — Z6832 Body mass index (BMI) 32.0-32.9, adult: Secondary | ICD-10-CM | POA: Diagnosis not present

## 2021-12-31 DIAGNOSIS — M25462 Effusion, left knee: Secondary | ICD-10-CM | POA: Diagnosis not present

## 2021-12-31 DIAGNOSIS — M170 Bilateral primary osteoarthritis of knee: Secondary | ICD-10-CM | POA: Diagnosis not present

## 2021-12-31 DIAGNOSIS — M25562 Pain in left knee: Secondary | ICD-10-CM | POA: Diagnosis not present

## 2022-01-28 DIAGNOSIS — R972 Elevated prostate specific antigen [PSA]: Secondary | ICD-10-CM | POA: Diagnosis not present

## 2022-01-29 DIAGNOSIS — M1712 Unilateral primary osteoarthritis, left knee: Secondary | ICD-10-CM | POA: Diagnosis not present

## 2022-01-29 DIAGNOSIS — M5136 Other intervertebral disc degeneration, lumbar region: Secondary | ICD-10-CM | POA: Diagnosis not present

## 2022-04-16 DIAGNOSIS — R0789 Other chest pain: Secondary | ICD-10-CM | POA: Diagnosis not present

## 2022-04-16 DIAGNOSIS — R002 Palpitations: Secondary | ICD-10-CM | POA: Diagnosis not present

## 2022-04-16 DIAGNOSIS — E039 Hypothyroidism, unspecified: Secondary | ICD-10-CM | POA: Insufficient documentation

## 2022-04-16 DIAGNOSIS — N200 Calculus of kidney: Secondary | ICD-10-CM | POA: Insufficient documentation

## 2022-04-16 DIAGNOSIS — I10 Essential (primary) hypertension: Secondary | ICD-10-CM | POA: Diagnosis not present

## 2022-04-17 DIAGNOSIS — H11153 Pinguecula, bilateral: Secondary | ICD-10-CM | POA: Diagnosis not present

## 2022-04-17 DIAGNOSIS — H40013 Open angle with borderline findings, low risk, bilateral: Secondary | ICD-10-CM | POA: Diagnosis not present

## 2022-04-17 DIAGNOSIS — R0602 Shortness of breath: Secondary | ICD-10-CM | POA: Diagnosis not present

## 2022-04-17 DIAGNOSIS — I4891 Unspecified atrial fibrillation: Secondary | ICD-10-CM | POA: Diagnosis not present

## 2022-04-21 DIAGNOSIS — R0602 Shortness of breath: Secondary | ICD-10-CM | POA: Diagnosis not present

## 2022-04-21 DIAGNOSIS — R002 Palpitations: Secondary | ICD-10-CM | POA: Diagnosis not present

## 2022-04-21 DIAGNOSIS — Z6832 Body mass index (BMI) 32.0-32.9, adult: Secondary | ICD-10-CM | POA: Diagnosis not present

## 2022-04-23 DIAGNOSIS — R062 Wheezing: Secondary | ICD-10-CM | POA: Diagnosis not present

## 2022-04-23 DIAGNOSIS — R0789 Other chest pain: Secondary | ICD-10-CM | POA: Diagnosis not present

## 2022-04-23 DIAGNOSIS — R0602 Shortness of breath: Secondary | ICD-10-CM | POA: Diagnosis not present

## 2022-04-27 NOTE — H&P (View-Only) (Signed)
Cardiology Office Note:    Date:  04/28/2022   ID:  Spencer Perkins, DOB 19-Feb-1971, MRN [id number]  PCP:  Ernestene Kiel, MD  Cardiologist:  Shirlee More, MD   Referring MD: Ernestene Kiel, MD  ASSESSMENT:    1. Precordial chest pain   2. SOB (shortness of breath) on exertion   3. Palpitation   4. Primary hypertension    PLAN:    In order of problems listed above:  He has developed a pattern of exertional shortness of breath chest pain quite suggestive of angina and the pattern seems to be becoming unstable I would recheck a high-sensitivity troponin start aspirin high intensity statin continue his beta-blocker and antihypertensive and plan for coronary angiography in the next 48 hours. If coronary angiography is unremarkable we can apply an event monitor Continue his current antihypertensive  Next appointment to be determined   Medication Adjustments/Labs and Tests Ordered: Current medicines are reviewed at length with the patient today.  Concerns regarding medicines are outlined above.  No orders of the defined types were placed in this encounter.  No orders of the defined types were placed in this encounter.    Chief Complaint  Patient presents with   Chest Pain   Shortness of Breath   Fatigue  His concern is chest pressure and shortness of breath  History of Present Illness:    Spencer Perkins is a 51 y.o. male who is being seen today for the evaluation of palpitation following ED visit [place] 04/16/2022 at the request of Ernestene Kiel, MD  He was seen by me previously in 2017 at [facility] with complaints of palpitation.  There is a notation that he had a normal stress echo 04/30/2015 with a well-preserved exercise tolerance at 12.8 METS normal EKG and event monitor with no findings of arrhythmia.  When seen in the emergency room 04/16/2022 his EKG showed sinus rhythm left axis deviation  otherwise normal and no arrhythmia was documented.  His magnesium was normal at 1.9 but he was given magnesium supplement to take orally and is troponin initial and repeat were normal and chest x-ray without abnormality.  Other testing including normal CBC hemoglobin 15.1 CMP with a sodium 142 potassium 3.6 creatinine 0.90  He has had an outpatient chest CTA performed showing no finding of pulmonary embolism  The afternoon of 04/10/2022 as were all changed.  He said he had about 1-1/2 days where his heart was not rapid but it was irregular he was quite uncomfortable and had a pressure sensation substernally radiates through to his back since then it is never quite gone away but is worse with activity improved with rest and now has developed exertional shortness of breath that causes him to stop and rest for relief.  He had an outpatient CTA of the chest performed 04/23/2022 the lungs were normal no finding of pulmonary embolism cardiovascular structures were normal. He has had no fever or chills his symptoms are nonpleuritic he has some mild costochondral tenderness on exam but does not reproduce his symptoms Concerned that he has clinical unstable angina I will recheck a high-sensitivity troponin and I think in this situation he is best served by direct referral to coronary angiography I did we do not feel comfortable waiting for outpatient elective cardiac CTA.  Risk benefits and options detailed.  If unremarkable he will require an event monitor He is having palpitation but as opposed to several weeks ago it  is brief and momentary the symptoms he had in the past. His primary concern is this new finding of chest pressure and exertional shortness of breath.  He has described his chest pain as pressure substernal radiates through the back worse with activity and completely relieved with rest not pleuritic in nature although he has occasional cough when he has a deep breath he has had no wheezing and no  trauma to his chest Past Medical History:  Diagnosis Date   Hypertension    Hypothyroidism    Kidney stones     tried extraction with no luck...then litho--all gone    Past Surgical History:  Procedure Laterality Date   EXTRACORPOREAL SHOCK WAVE LITHOTRIPSY     2012   MAXIMUM ACCESS (MAS)POSTERIOR LUMBAR INTERBODY FUSION (PLIF) 1 LEVEL N/A 10/10/2013   Procedure: LUMBAR FOUR TO LUMBAR FIVE MAXIMUM ACCESS (MAS) POSTERIOR LUMBAR INTERBODY FUSION (PLIF) 1 LEVEL;  Surgeon: Reinaldo Meeker, MD;  Location: [facility];  Service: Neurosurgery;  Laterality: N/A;    Current Medications: Current Meds  Medication Sig   carvedilol (COREG) 6.25 MG tablet Take 6.25 mg by mouth 2 (two) times daily with a meal.   levothyroxine (SYNTHROID) 100 MCG tablet Take 100 mcg by mouth every morning.   losartan (COZAAR) 25 MG tablet Take 25 mg by mouth daily.   Magnesium 400 MG TABS Take 483 mg by mouth every 12 (twelve) hours.   [DISCONTINUED] levothyroxine (SYNTHROID, LEVOTHROID) 75 MCG tablet Take 75 mcg by mouth daily before breakfast.   [DISCONTINUED] methylPREDNISolone (MEDROL DOSEPAK) 4 MG TBPK tablet Take as instructed   [DISCONTINUED] oxyCODONE-acetaminophen (PERCOCET/ROXICET) 5-325 MG per tablet Take 1-2 tablets by mouth every 4 (four) hours as needed for moderate pain.   [DISCONTINUED] triamterene-hydrochlorothiazide (MAXZIDE-25) 37.5-25 MG per tablet Take 0.5 tablets by mouth daily.     Allergies:   Codeine   Social History   Socioeconomic History   Marital status: Single    Spouse name: Not on file   Number of children: Not on file   Years of education: Not on file   Highest education level: Not on file  Occupational History   Not on file  Tobacco Use   Smoking status: Never   Smokeless tobacco: Never  Substance and Sexual Activity   Alcohol use: No   Drug use: No   Sexual activity: Not on file  Other Topics Concern   Not on file  Social History Narrative   Not on file    Social Determinants of Health   Financial Resource Strain: Not on file  Food Insecurity: Not on file  Transportation Needs: Not on file  Physical Activity: Not on file  Stress: Not on file  Social Connections: Not on file     Family History: The patient's family history includes Heart disease in his mother.  ROS:   ROS Please see the history of present illness.     All other systems reviewed and are negative.  EKGs/Labs/Other Studies Reviewed:    The following studies were reviewed today:   EKG:  EKG is  ordered today.  The ekg ordered today is personally reviewed and demonstrates sinus rhythm no pattern of ischemia or injury but he has loss of R wave in V2 possible anterior septal MI left axis deviation left anterior hemiblock    Physical Exam:    VS:  BP (!) 132/90 (BP Location: Right Arm, Patient Position: Sitting)   Pulse 75   Ht 6' (1.829 m)  Wt 244 lb 6.4 oz (110.9 kg)   SpO2 96%   BMI 33.15 kg/m     Wt Readings from Last 3 Encounters:  04/28/22 244 lb 6.4 oz (110.9 kg)  10/10/13 234 lb 5 oz (106.3 kg)  10/05/13 234 lb 5 oz (106.3 kg)     GEN:  Well nourished, well developed in no acute distress HEENT: Normal NECK: No JVD; No carotid bruits LYMPHATICS: No lymphadenopathy CARDIAC: RRR, no murmurs, rubs, gallops he has mild tenderness left CC J does not reproduce his clinical complaint RESPIRATORY:  Clear to auscultation without rales, wheezing or rhonchi  ABDOMEN: Soft, non-tender, non-distended MUSCULOSKELETAL:  No edema; No deformity  SKIN: Warm and dry NEUROLOGIC:  Alert and oriented x 3 PSYCHIATRIC:  Normal affect     Signed, Shirlee More, MD  04/28/2022 4:11 PM    [facility]

## 2022-04-27 NOTE — Progress Notes (Unsigned)
Cardiology Office Note:    Date:  04/27/2022   ID:  Spencer Perkins, DOB [birth year], MRN [id number]  PCP:  Ernestene Kiel, MD  Cardiologist:  Shirlee More, MD   Referring MD: Ernestene Kiel, MD  ASSESSMENT:    No diagnosis found. PLAN:    In order of problems listed above:  ***  Next appointment   Medication Adjustments/Labs and Tests Ordered: Current medicines are reviewed at length with the patient today.  Concerns regarding medicines are outlined above.  No orders of the defined types were placed in this encounter.  No orders of the defined types were placed in this encounter.    No chief complaint on file. ***  History of Present Illness:    Spencer Perkins is a 51 y.o. male who is being seen today for the evaluation of palpitation following ED visit [place] 04/16/2022 at the request of Ernestene Kiel, MD  He was seen by me previously in 2017 at [facility] with complaints of palpitation.  There is a notation that he had a normal stress echo 04/30/2015 with a well-preserved exercise tolerance at 12.8 METS normal EKG and event monitor with no findings of arrhythmia.  When seen in the emergency room his EKG showed sinus rhythm left axis deviation otherwise normal and no arrhythmia was documented.  His magnesium was normal at 1.9 but he was given magnesium supplement to take orally and is troponin initial and repeat were normal and chest x-ray without abnormality.  Other testing including normal CBC hemoglobin 15.1 CMP with a sodium 142 potassium 3.6 creatinine 0.90 Past Medical History:  Diagnosis Date   Hypertension    Hypothyroidism    Kidney stones     tried extraction with no luck...then litho--all gone    Past Surgical History:  Procedure Laterality Date   EXTRACORPOREAL SHOCK WAVE LITHOTRIPSY     2012   MAXIMUM ACCESS (MAS)POSTERIOR LUMBAR INTERBODY FUSION (PLIF) 1 LEVEL N/A 10/10/2013    Procedure: LUMBAR FOUR TO LUMBAR FIVE MAXIMUM ACCESS (MAS) POSTERIOR LUMBAR INTERBODY FUSION (PLIF) 1 LEVEL;  Surgeon: Faythe Ghee, MD;  Location: [facility];  Service: Neurosurgery;  Laterality: N/A;    Current Medications: No outpatient medications have been marked as taking for the 04/28/22 encounter (Appointment) with Richardo Priest, MD.     Allergies:   Codeine   Social History   Socioeconomic History   Marital status: Single    Spouse name: Not on file   Number of children: Not on file   Years of education: Not on file   Highest education level: Not on file  Occupational History   Not on file  Tobacco Use   Smoking status: Never   Smokeless tobacco: Never  Substance and Sexual Activity   Alcohol use: No   Drug use: No   Sexual activity: Not on file  Other Topics Concern   Not on file  Social History Narrative   Not on file   Social Determinants of Health   Financial Resource Strain: Not on file  Food Insecurity: Not on file  Transportation Needs: Not on file  Physical Activity: Not on file  Stress: Not on file  Social Connections: Not on file     Family History: The patient's ***family history is not on file.  ROS:   ROS Please see the history of present illness.    *** All other systems reviewed and are negative.  EKGs/Labs/Other Studies Reviewed:  The following studies were reviewed today: ***  EKG:  EKG is *** ordered today.  The ekg ordered today is personally reviewed and demonstrates ***  Recent Labs: No results found for requested labs within last 365 days.  Recent Lipid Panel No results found for: "CHOL", "TRIG", "HDL", "CHOLHDL", "VLDL", "LDLCALC", "LDLDIRECT"  Physical Exam:    VS:  There were no vitals taken for this visit.    Wt Readings from Last 3 Encounters:  10/10/13 234 lb 5 oz (106.3 kg)  10/05/13 234 lb 5 oz (106.3 kg)     GEN: *** Well nourished, well developed in no acute distress HEENT: Normal NECK: No JVD; No  carotid bruits LYMPHATICS: No lymphadenopathy CARDIAC: ***RRR, no murmurs, rubs, gallops RESPIRATORY:  Clear to auscultation without rales, wheezing or rhonchi  ABDOMEN: Soft, non-tender, non-distended MUSCULOSKELETAL:  No edema; No deformity  SKIN: Warm and dry NEUROLOGIC:  Alert and oriented x 3 PSYCHIATRIC:  Normal affect     Signed, Norman Herrlich, MD  04/27/2022 11:53 AM    [facility]

## 2022-04-28 ENCOUNTER — Ambulatory Visit: Payer: BC Managed Care – PPO | Attending: Cardiology | Admitting: Cardiology

## 2022-04-28 ENCOUNTER — Encounter: Payer: Self-pay | Admitting: Cardiology

## 2022-04-28 VITALS — BP 132/90 | HR 75 | Ht 72.0 in | Wt 244.4 lb

## 2022-04-28 DIAGNOSIS — R0602 Shortness of breath: Secondary | ICD-10-CM | POA: Diagnosis not present

## 2022-04-28 DIAGNOSIS — I10 Essential (primary) hypertension: Secondary | ICD-10-CM

## 2022-04-28 DIAGNOSIS — R002 Palpitations: Secondary | ICD-10-CM | POA: Diagnosis not present

## 2022-04-28 DIAGNOSIS — R072 Precordial pain: Secondary | ICD-10-CM | POA: Diagnosis not present

## 2022-04-28 MED ORDER — ASPIRIN 81 MG PO TBEC: 81.0000 mg | DELAYED_RELEASE_TABLET | Freq: Every day | ORAL | 3 refills | Status: DC

## 2022-04-28 MED ORDER — ROSUVASTATIN CALCIUM 20 MG PO TABS: 20.0000 mg | ORAL_TABLET | Freq: Every day | ORAL | 0 refills | Status: DC

## 2022-04-28 NOTE — Addendum Note (Signed)
Addended by: Jerl Santos R on: 04/28/2022 04:53 PM   Modules accepted: Orders

## 2022-04-28 NOTE — Patient Instructions (Addendum)
1. Avoid all over-the-counter antihistamines except Claritin/Loratadine and Zyrtec/Cetrizine. 2. Avoid all combination including cold sinus allergies flu decongestant and sleep medications 3. You can use Robitussin DM Mucinex and Mucinex DM for cough. 4. can use Tylenol aspirin ibuprofen and naproxen but no combinations such as sleep or sinus. Medication Instructions:  Your physician has recommended you make the following change in your medication:  Start Aspirin 81 mg, coated, once daily Start Rosuvastatin 20 mg once daily for 2 weeks *If you need a refill on your cardiac medications before your next appointment, please call your pharmacy*   Lab Work: Your physician recommends that you return for lab work in: Today for a BMP and a Troponin level  If you have labs (blood work) drawn today and your tests are completely normal, you will receive your results only by: [facility] (if you have MyChart) OR A paper copy in the mail If you have any lab test that is abnormal or we need to change your treatment, we will call you to review the results.   Testing/Procedures:    Cardiac/Peripheral Catheterization   You are scheduled for a Cardiac Catheterization on Thursday, October 12 with Dr. Larae Grooms.  1. Please arrive at the Main Entrance A at [facility]: [place], [place] [address] on October 12 at 8:00 AM (This time is two hours before your procedure to ensure your preparation). Free valet parking service is available. You will check in at ADMITTING. The support person will be asked to wait in the waiting room.  It is OK to have someone drop you off and come back when you are ready to be discharged.        Special note: Every effort is made to have your procedure done on time. Please understand that emergencies sometimes delay scheduled procedures.   . 2. Diet: Do not eat solid foods after midnight.  You may have clear liquids until 5 AM the day of the  procedure.  3. Labs: You will need to have blood drawn today for a BMP and a Troponin level 4. Medication instructions in preparation for your procedure:   Contrast Allergy: No   Current Outpatient Medications (Endocrine & Metabolic):    levothyroxine (SYNTHROID) 100 MCG tablet, Take 100 mcg by mouth every morning.  Current Outpatient Medications (Cardiovascular):    carvedilol (COREG) 6.25 MG tablet, Take 6.25 mg by mouth 2 (two) times daily with a meal.   losartan (COZAAR) 25 MG tablet, Take 25 mg by mouth daily.   rosuvastatin (CRESTOR) 20 MG tablet, Take 1 tablet (20 mg total) by mouth daily.   Current Outpatient Medications (Analgesics):    aspirin EC 81 MG tablet, Take 1 tablet (81 mg total) by mouth daily. Swallow whole.   Current Outpatient Medications (Other):    Magnesium 400 MG TABS, Take 483 mg by mouth every 12 (twelve) hours. *For reference purposes while preparing patient instructions.   Delete this med list prior to printing instructions for patient.*    Stop taking Losartan for 24 hours prior to procedure      On the morning of your procedure, take Aspirin 81 mg and any morning medicines NOT listed above.  You may use sips of water.  5. Plan to go home the same day, you will only stay overnight if medically necessary. 6. You MUST have a responsible adult to drive you home. 7. An adult MUST be with you the first 24 hours after you arrive home.  8. Bring a current list of your medications, and the last time and date medication taken. 9. Bring ID and current insurance cards. 10.Please wear clothes that are easy to get on and off and wear slip-on shoes.  Thank you for allowing [place] to care for you!   -- [facility]    Follow-Up: At [facility], you and your health needs are our priority.  As part of our continuing mission to provide you with exceptional heart care, we have created designated Provider Care Teams.   These Care Teams include your primary Cardiologist (physician) and Advanced Practice Providers (APPs -  Physician Assistants and Nurse Practitioners) who all work together to provide you with the care you need, when you need it.  We recommend signing up for the patient portal called "MyChart".  Sign up information is provided on this After Visit Summary.  MyChart is used to connect with patients for Virtual Visits (Telemedicine).  Patients are able to view lab/test results, encounter notes, upcoming appointments, etc.  Non-urgent messages can be sent to your provider as well.   To learn more about what you can do with MyChart, go to [email or web address].    Your next appointment:   4 week(s)  The format for your next appointment:   In Person  Provider:   Shirlee More, MD    Other Instructions   Important Information About Sugar

## 2022-04-28 NOTE — Addendum Note (Signed)
Addended by: Jerl Santos R on: 04/28/2022 04:39 PM   Modules accepted: Orders

## 2022-04-29 ENCOUNTER — Telehealth: Payer: Self-pay | Admitting: *Deleted

## 2022-04-29 LAB — BASIC METABOLIC PANEL
BUN/Creatinine Ratio: 19 (ref 9–20)
BUN: 18 mg/dL (ref 6–24)
CO2: 26 mmol/L (ref 20–29)
Calcium: 9.7 mg/dL (ref 8.7–10.2)
Chloride: 101 mmol/L (ref 96–106)
Creatinine, Ser: 0.95 mg/dL (ref 0.76–1.27)
Glucose: 105 mg/dL — ABNORMAL HIGH (ref 70–99)
Potassium: 4.6 mmol/L (ref 3.5–5.2)
Sodium: 140 mmol/L (ref 134–144)
eGFR: 96 mL/min/{1.73_m2} (ref >59)

## 2022-04-29 LAB — TROPONIN T: Troponin T (Highly Sensitive): <6 ng/L (ref 0–22)

## 2022-04-29 NOTE — Telephone Encounter (Addendum)
Cardiac Catheterization scheduled at [facility] for: Thursday April 30, 2022 10 AM Arrival time and place: [facility] Main Entrance A at: 8 AM  Nothing to eat after midnight prior to procedure, clear liquids until 5 AM day of procedure.  Medication instructions: -Usual morning medications can be taken with sips of water including aspirin 81 mg.  Confirmed patient has responsible adult to drive home post procedure and be with patient first 24 hours after arriving home.  Patient reports no new symptoms concerning for COVID-19 in the past 10 days.   Left message for patient to call back to review procedure instructions.

## 2022-04-29 NOTE — Telephone Encounter (Signed)
Call placed to patient to review procedure instructions, no answer,left voicemail message with procedure instructions, call our office if questions.

## 2022-04-30 ENCOUNTER — Encounter (HOSPITAL_COMMUNITY)
Admission: RE | Disposition: A | Discharge status: Home / Self Care | Payer: Self-pay | Source: Home / Self Care | Attending: Interventional Cardiology

## 2022-04-30 ENCOUNTER — Ambulatory Visit (HOSPITAL_COMMUNITY)
Admission: RE | Admit: 2022-04-30 | Discharge: 2022-04-30 | Disposition: A | Discharge status: Home / Self Care | Payer: BC Managed Care – PPO | Attending: Interventional Cardiology | Admitting: Interventional Cardiology

## 2022-04-30 DIAGNOSIS — I10 Essential (primary) hypertension: Secondary | ICD-10-CM | POA: Diagnosis not present

## 2022-04-30 DIAGNOSIS — R0602 Shortness of breath: Secondary | ICD-10-CM | POA: Insufficient documentation

## 2022-04-30 DIAGNOSIS — I2510 Atherosclerotic heart disease of native coronary artery without angina pectoris: Secondary | ICD-10-CM | POA: Insufficient documentation

## 2022-04-30 DIAGNOSIS — R002 Palpitations: Secondary | ICD-10-CM | POA: Insufficient documentation

## 2022-04-30 DIAGNOSIS — R072 Precordial pain: Secondary | ICD-10-CM | POA: Insufficient documentation

## 2022-04-30 HISTORY — PX: LEFT HEART CATH AND CORONARY ANGIOGRAPHY: CATH118249

## 2022-04-30 SURGERY — LEFT HEART CATH AND CORONARY ANGIOGRAPHY
Anesthesia: LOCAL

## 2022-04-30 MED ORDER — SODIUM CHLORIDE 0.9% FLUSH: 3.0000 mL | Freq: Two times a day (BID) | INTRAVENOUS | Status: DC

## 2022-04-30 MED ORDER — MIDAZOLAM HCL 2 MG/2ML IJ SOLN
INTRAMUSCULAR | Status: DC | PRN
  Administered 2022-04-30: 1 mg via INTRAVENOUS
  Administered 2022-04-30: 2 mg via INTRAVENOUS

## 2022-04-30 MED ORDER — SODIUM CHLORIDE 0.9 % WEIGHT BASED INFUSION: 1.0000 mL/kg/h | INTRAVENOUS | Status: DC

## 2022-04-30 MED ORDER — HYDRALAZINE HCL 20 MG/ML IJ SOLN: 10.0000 mg | INTRAMUSCULAR | Status: DC | PRN

## 2022-04-30 MED ORDER — HEPARIN (PORCINE) IN NACL 1000-0.9 UT/500ML-% IV SOLN
INTRAVENOUS | Status: AC
  Filled 2022-04-30: qty 500

## 2022-04-30 MED ORDER — LIDOCAINE HCL (PF) 1 % IJ SOLN
INTRAMUSCULAR | Status: AC
  Filled 2022-04-30: qty 30

## 2022-04-30 MED ORDER — VERAPAMIL HCL 2.5 MG/ML IV SOLN
INTRAVENOUS | Status: DC | PRN
  Administered 2022-04-30: 10 mL via INTRA_ARTERIAL

## 2022-04-30 MED ORDER — ACETAMINOPHEN 325 MG PO TABS: 650.0000 mg | ORAL_TABLET | ORAL | Status: DC | PRN

## 2022-04-30 MED ORDER — SODIUM CHLORIDE 0.9 % IV SOLN: 250.0000 mL | INTRAVENOUS | Status: DC | PRN

## 2022-04-30 MED ORDER — SODIUM CHLORIDE 0.9 % WEIGHT BASED INFUSION
3.0000 mL/kg/h | INTRAVENOUS | Status: AC
  Administered 2022-04-30: 3 mL/kg/h via INTRAVENOUS

## 2022-04-30 MED ORDER — HEPARIN SODIUM (PORCINE) 1000 UNIT/ML IJ SOLN
INTRAMUSCULAR | Status: AC
  Filled 2022-04-30: qty 10

## 2022-04-30 MED ORDER — HEPARIN SODIUM (PORCINE) 1000 UNIT/ML IJ SOLN
INTRAMUSCULAR | Status: DC | PRN
  Administered 2022-04-30: 6000 [IU] via INTRAVENOUS

## 2022-04-30 MED ORDER — ONDANSETRON HCL 4 MG/2ML IJ SOLN: 4.0000 mg | Freq: Four times a day (QID) | INTRAMUSCULAR | Status: DC | PRN

## 2022-04-30 MED ORDER — ASPIRIN 81 MG PO CHEW
81.0000 mg | CHEWABLE_TABLET | ORAL | Status: AC
  Administered 2022-04-30: 81 mg via ORAL
  Filled 2022-04-30: qty 1

## 2022-04-30 MED ORDER — VERAPAMIL HCL 2.5 MG/ML IV SOLN
INTRAVENOUS | Status: AC
  Filled 2022-04-30: qty 2

## 2022-04-30 MED ORDER — SODIUM CHLORIDE 0.9% FLUSH: 3.0000 mL | INTRAVENOUS | Status: DC | PRN

## 2022-04-30 MED ORDER — LABETALOL HCL 5 MG/ML IV SOLN: 10.0000 mg | INTRAVENOUS | Status: DC | PRN

## 2022-04-30 MED ORDER — MIDAZOLAM HCL 2 MG/2ML IJ SOLN
INTRAMUSCULAR | Status: AC
  Filled 2022-04-30: qty 2

## 2022-04-30 MED ORDER — ASPIRIN 81 MG PO TBEC: 81.0000 mg | DELAYED_RELEASE_TABLET | Freq: Every day | ORAL | 3 refills | Status: AC

## 2022-04-30 MED ORDER — SODIUM CHLORIDE 0.9 % IV SOLN: INTRAVENOUS | Status: AC

## 2022-04-30 MED ORDER — FENTANYL CITRATE (PF) 100 MCG/2ML IJ SOLN
INTRAMUSCULAR | Status: AC
  Filled 2022-04-30: qty 2

## 2022-04-30 MED ORDER — IOHEXOL 350 MG/ML SOLN
INTRAVENOUS | Status: DC | PRN
  Administered 2022-04-30: 60 mL

## 2022-04-30 MED ORDER — ROSUVASTATIN CALCIUM 20 MG PO TABS: 20.0000 mg | ORAL_TABLET | Freq: Every day | ORAL | 0 refills | Status: DC

## 2022-04-30 MED ORDER — HEPARIN (PORCINE) IN NACL 1000-0.9 UT/500ML-% IV SOLN
INTRAVENOUS | Status: DC | PRN
  Administered 2022-04-30 (×2): 500 mL

## 2022-04-30 MED ORDER — LIDOCAINE HCL (PF) 1 % IJ SOLN
INTRAMUSCULAR | Status: DC | PRN
  Administered 2022-04-30: 2 mL

## 2022-04-30 MED ORDER — FENTANYL CITRATE (PF) 100 MCG/2ML IJ SOLN
INTRAMUSCULAR | Status: DC | PRN
  Administered 2022-04-30 (×2): 25 ug via INTRAVENOUS

## 2022-04-30 SURGICAL SUPPLY — 10 items
BAND ZEPHYR COMPRESS 30 LONG (HEMOSTASIS) IMPLANT
CATH 5FR JL3.5 JR4 ANG PIG MP (CATHETERS) IMPLANT
GLIDESHEATH SLEND SS 6F .021 (SHEATH) IMPLANT
GUIDEWIRE INQWIRE 1.5J.035X260 (WIRE) IMPLANT
INQWIRE 1.5J .035X260CM (WIRE) ×1
KIT HEART LEFT (KITS) ×1 IMPLANT
PACK CARDIAC CATHETERIZATION (CUSTOM PROCEDURE TRAY) ×1 IMPLANT
SYR MEDRAD MARK 7 150ML (SYRINGE) ×1 IMPLANT
TRANSDUCER W/STOPCOCK (MISCELLANEOUS) ×1 IMPLANT
TUBING CIL FLEX 10 FLL-RA (TUBING) ×1 IMPLANT

## 2022-04-30 NOTE — Interval H&P Note (Signed)
Cath Lab Visit (complete for each Cath Lab visit)  Clinical Evaluation Leading to the Procedure:   ACS: Yes.    Non-ACS:    Anginal Classification: CCS IV  Anti-ischemic medical therapy: Minimal Therapy (1 class of medications)  Non-Invasive Test Results: No non-invasive testing performed  Prior CABG: No previous CABG      History and Physical Interval Note:  04/30/2022 9:38 AM  Spencer Perkins  has presented today for surgery, with the diagnosis of shortness of breath - chest pain.  The various methods of treatment have been discussed with the patient and family. After consideration of risks, benefits and other options for treatment, the patient has consented to  Procedure(s): LEFT HEART CATH AND CORONARY ANGIOGRAPHY (N/A) as a surgical intervention.  The patient's history has been reviewed, patient examined, no change in status, stable for surgery.  I have reviewed the patient's chart and labs.  Questions were answered to the patient's satisfaction.     Larae Grooms

## 2022-05-01 ENCOUNTER — Encounter (HOSPITAL_COMMUNITY): Payer: Self-pay | Admitting: Interventional Cardiology

## 2022-05-01 ENCOUNTER — Telehealth: Payer: Self-pay | Admitting: Cardiology

## 2022-05-01 NOTE — Telephone Encounter (Signed)
Called pt advised of MD response had no questions or concerns.  "Cath was good, need to fu with his PCP looking for other causes not heart"

## 2022-05-01 NOTE — Telephone Encounter (Signed)
Patient had heart cath yesterday and would like to go over results along with what the next step is has his condition is still an issue.  Please call [phone], and you can leave a message but also if you'd reach out to [phone] is the wife's number and she may be easier to reach.  Thank you!

## 2022-05-05 ENCOUNTER — Other Ambulatory Visit: Payer: Self-pay | Admitting: Cardiology

## 2022-05-05 DIAGNOSIS — R002 Palpitations: Secondary | ICD-10-CM | POA: Diagnosis not present

## 2022-05-05 DIAGNOSIS — R209 Unspecified disturbances of skin sensation: Secondary | ICD-10-CM | POA: Diagnosis not present

## 2022-05-05 DIAGNOSIS — R5383 Other fatigue: Secondary | ICD-10-CM | POA: Diagnosis not present

## 2022-05-05 DIAGNOSIS — I10 Essential (primary) hypertension: Secondary | ICD-10-CM | POA: Diagnosis not present

## 2022-05-05 DIAGNOSIS — E559 Vitamin D deficiency, unspecified: Secondary | ICD-10-CM | POA: Diagnosis not present

## 2022-05-05 DIAGNOSIS — Z79899 Other long term (current) drug therapy: Secondary | ICD-10-CM | POA: Diagnosis not present

## 2022-05-05 NOTE — Progress Notes (Signed)
Ordered Event Monitor for pt to be put on here. See Dr. Joya Gaskins office notes.

## 2022-05-13 DIAGNOSIS — I10 Essential (primary) hypertension: Secondary | ICD-10-CM | POA: Diagnosis not present

## 2022-05-13 DIAGNOSIS — M549 Dorsalgia, unspecified: Secondary | ICD-10-CM | POA: Diagnosis not present

## 2022-05-13 DIAGNOSIS — Z79899 Other long term (current) drug therapy: Secondary | ICD-10-CM | POA: Diagnosis not present

## 2022-05-28 NOTE — Progress Notes (Unsigned)
Cardiology Office Note:    Date:  05/28/2022   ID:  Spencer Perkins, DOB 26-Feb-1971, MRN [id number]  PCP:  Philemon Kingdom, MD  Cardiologist:  Norman Herrlich, MD    Referring MD: Philemon Kingdom, MD    ASSESSMENT:    No diagnosis found. PLAN:    In order of problems listed above:  ***   Next appointment: ***   Medication Adjustments/Labs and Tests Ordered: Current medicines are reviewed at length with the patient today.  Concerns regarding medicines are outlined above.  No orders of the defined types were placed in this encounter.  No orders of the defined types were placed in this encounter.   No chief complaint on file.   History of Present Illness:    Spencer Perkins is a 51 y.o. male with a hx of palpitation hypertension exertional shortness of breath and chest pain last seen 04/28/2022.  His pattern is suggestive of acute coronary syndrome outpatient high-sensitivity troponin was normal and he underwent direct referral to coronary angiography for quick evaluation.  Compliance with diet, lifestyle and medications: ***  He underwent left heart catheterization 04/30/2022 which showed mild coronary atherosclerosis but no obstructive CAD or stenotic lesion. Procedures  LEFT HEART CATH AND CORONARY ANGIOGRAPHY   Conclusion   The left ventricular systolic function is normal.   LV end diastolic pressure is mildly elevated.  LVEDP 20 mm Hg.   The left ventricular ejection fraction is 55-65% by visual estimate.   There is no aortic valve stenosis.   Minimal, nonobstructive coronary atherosclerosis. Past Medical History:  Diagnosis Date   Hypertension    Hypothyroidism    Kidney stones     tried extraction with no luck...then litho--all gone    Past Surgical History:  Procedure Laterality Date   EXTRACORPOREAL SHOCK WAVE LITHOTRIPSY     2012   LEFT HEART CATH AND CORONARY ANGIOGRAPHY N/A 04/30/2022   Procedure: LEFT HEART CATH AND CORONARY ANGIOGRAPHY;   Surgeon: Corky Crafts, MD;  Location: [facility] INVASIVE CV LAB;  Service: Cardiovascular;  Laterality: N/A;   MAXIMUM ACCESS (MAS)POSTERIOR LUMBAR INTERBODY FUSION (PLIF) 1 LEVEL N/A 10/10/2013   Procedure: LUMBAR FOUR TO LUMBAR FIVE MAXIMUM ACCESS (MAS) POSTERIOR LUMBAR INTERBODY FUSION (PLIF) 1 LEVEL;  Surgeon: Reinaldo Meeker, MD;  Location: [facility];  Service: Neurosurgery;  Laterality: N/A;    Current Medications: No outpatient medications have been marked as taking for the 06/01/22 encounter (Appointment) with Baldo Daub, MD.     Allergies:   Codeine   Social History   Socioeconomic History   Marital status: Single    Spouse name: Not on file   Number of children: Not on file   Years of education: Not on file   Highest education level: Not on file  Occupational History   Not on file  Tobacco Use   Smoking status: Never   Smokeless tobacco: Never  Substance and Sexual Activity   Alcohol use: No   Drug use: No   Sexual activity: Not on file  Other Topics Concern   Not on file  Social History Narrative   Not on file   Social Determinants of Health   Financial Resource Strain: Not on file  Food Insecurity: Not on file  Transportation Needs: Not on file  Physical Activity: Not on file  Stress: Not on file  Social Connections: Not on file     Family History: The patient's ***family history includes Heart disease in his mother. ROS:  Please see the history of present illness.    All other systems reviewed and are negative.  EKGs/Labs/Other Studies Reviewed:    The following studies were reviewed today:  EKG:  EKG ordered today and personally reviewed.  The ekg ordered today demonstrates ***  Recent Labs: 04/28/2022: BUN 18; Creatinine, Ser 0.95; Potassium 4.6; Sodium 140  Recent Lipid Panel No results found for: "CHOL", "TRIG", "HDL", "CHOLHDL", "VLDL", "LDLCALC", "LDLDIRECT"  Physical Exam:    VS:  There were no vitals taken for this visit.     Wt Readings from Last 3 Encounters:  04/30/22 255 lb (115.7 kg)  04/28/22 244 lb 6.4 oz (110.9 kg)  10/10/13 234 lb 5 oz (106.3 kg)     GEN: *** Well nourished, well developed in no acute distress HEENT: Normal NECK: No JVD; No carotid bruits LYMPHATICS: No lymphadenopathy CARDIAC: ***RRR, no murmurs, rubs, gallops RESPIRATORY:  Clear to auscultation without rales, wheezing or rhonchi  ABDOMEN: Soft, non-tender, non-distended MUSCULOSKELETAL:  No edema; No deformity  SKIN: Warm and dry NEUROLOGIC:  Alert and oriented x 3 PSYCHIATRIC:  Normal affect    Signed, Norman Herrlich, MD  05/28/2022 8:30 AM    [facility]

## 2022-06-01 ENCOUNTER — Encounter: Payer: Self-pay | Admitting: Cardiology

## 2022-06-01 ENCOUNTER — Ambulatory Visit: Payer: BC Managed Care – PPO | Attending: Cardiology | Admitting: Cardiology

## 2022-06-01 VITALS — BP 134/88 | HR 84 | Ht 72.0 in | Wt 243.6 lb

## 2022-06-01 DIAGNOSIS — I10 Essential (primary) hypertension: Secondary | ICD-10-CM | POA: Diagnosis not present

## 2022-06-01 DIAGNOSIS — R072 Precordial pain: Secondary | ICD-10-CM | POA: Diagnosis not present

## 2022-06-01 MED ORDER — ROSUVASTATIN CALCIUM 10 MG PO TABS: 10.0000 mg | ORAL_TABLET | Freq: Every day | ORAL | 0 refills | Status: AC

## 2022-06-01 NOTE — Patient Instructions (Signed)
Medication Instructions:  Your physician has recommended you make the following change in your medication:   Restart Rosuvastatin 1/2 tablet daily.  *If you need a refill on your cardiac medications before your next appointment, please call your pharmacy*   Lab Work: None ordered If you have labs (blood work) drawn today and your tests are completely normal, you will receive your results only by: MyChart Message (if you have MyChart) OR A paper copy in the mail If you have any lab test that is abnormal or we need to change your treatment, we will call you to review the results.   Testing/Procedures: None ordered   Follow-Up: At [facility], you and your health needs are our priority.  As part of our continuing mission to provide you with exceptional heart care, we have created designated Provider Care Teams.  These Care Teams include your primary Cardiologist (physician) and Advanced Practice Providers (APPs -  Physician Assistants and Nurse Practitioners) who all work together to provide you with the care you need, when you need it.  We recommend signing up for the patient portal called "MyChart".  Sign up information is provided on this After Visit Summary.  MyChart is used to connect with patients for Virtual Visits (Telemedicine).  Patients are able to view lab/test results, encounter notes, upcoming appointments, etc.  Non-urgent messages can be sent to your provider as well.   To learn more about what you can do with MyChart, go to [email or web address].    Your next appointment:   As needed  The format for your next appointment:   In Person  Provider:   Norman Herrlich, MD   Other Instructions NA

## 2022-07-30 ENCOUNTER — Other Ambulatory Visit: Payer: Self-pay | Admitting: Internal Medicine

## 2022-07-30 DIAGNOSIS — I10 Essential (primary) hypertension: Secondary | ICD-10-CM

## 2022-08-04 ENCOUNTER — Ambulatory Visit: Payer: BC Managed Care – PPO | Admitting: Allergy
